# Patient Record
Sex: Female | Born: 1950 | Race: Black or African American | Hispanic: No | Smoking: Never smoker
Health system: Southern US, Community
[De-identification: ages and names within clinical notes are randomized; demographics above are authoritative.]

## PROBLEM LIST (undated history)

## (undated) DIAGNOSIS — T8859XA Other complications of anesthesia, initial encounter: Secondary | ICD-10-CM

## (undated) DIAGNOSIS — R7303 Prediabetes: Secondary | ICD-10-CM

## (undated) DIAGNOSIS — D352 Benign neoplasm of pituitary gland: Secondary | ICD-10-CM

## (undated) DIAGNOSIS — D649 Anemia, unspecified: Secondary | ICD-10-CM

## (undated) DIAGNOSIS — H409 Unspecified glaucoma: Secondary | ICD-10-CM

## (undated) DIAGNOSIS — I10 Essential (primary) hypertension: Secondary | ICD-10-CM

## (undated) DIAGNOSIS — M1990 Unspecified osteoarthritis, unspecified site: Secondary | ICD-10-CM

## (undated) HISTORY — PX: ABDOMINAL HYSTERECTOMY: SHX81

## (undated) HISTORY — DX: Anemia, unspecified: D64.9

## (undated) HISTORY — PX: BRAIN SURGERY: SHX531

## (undated) HISTORY — PX: CHOLECYSTECTOMY: SHX55

---

## 2004-10-22 ENCOUNTER — Ambulatory Visit: Payer: Self-pay | Admitting: Internal Medicine

## 2005-03-16 ENCOUNTER — Emergency Department: Payer: Self-pay | Admitting: Emergency Medicine

## 2005-07-23 ENCOUNTER — Ambulatory Visit: Payer: Self-pay | Admitting: Ophthalmology

## 2005-08-26 HISTORY — PX: BRAIN SURGERY: SHX531

## 2005-09-20 ENCOUNTER — Encounter: Payer: Self-pay | Admitting: Neurosurgery

## 2005-09-23 ENCOUNTER — Encounter: Payer: Self-pay | Admitting: Neurosurgery

## 2005-10-24 ENCOUNTER — Encounter: Payer: Self-pay | Admitting: Neurosurgery

## 2005-11-04 ENCOUNTER — Ambulatory Visit: Payer: Self-pay | Admitting: Internal Medicine

## 2005-11-23 HISTORY — PX: PITUITARY SURGERY: SHX203

## 2006-03-21 DIAGNOSIS — D352 Benign neoplasm of pituitary gland: Secondary | ICD-10-CM | POA: Diagnosis present

## 2006-03-21 DIAGNOSIS — D329 Benign neoplasm of meninges, unspecified: Secondary | ICD-10-CM | POA: Insufficient documentation

## 2006-03-31 ENCOUNTER — Ambulatory Visit: Payer: Self-pay

## 2007-03-01 ENCOUNTER — Ambulatory Visit: Payer: Self-pay

## 2007-03-03 ENCOUNTER — Ambulatory Visit: Payer: Self-pay

## 2008-07-09 ENCOUNTER — Ambulatory Visit: Payer: Self-pay | Admitting: Family Medicine

## 2009-07-15 ENCOUNTER — Ambulatory Visit: Payer: Self-pay | Admitting: Family Medicine

## 2010-07-28 ENCOUNTER — Ambulatory Visit: Payer: Self-pay | Admitting: Family Medicine

## 2011-08-25 ENCOUNTER — Ambulatory Visit: Payer: Self-pay | Admitting: Family Medicine

## 2013-07-24 ENCOUNTER — Ambulatory Visit: Payer: Self-pay | Admitting: Family Medicine

## 2014-08-05 ENCOUNTER — Ambulatory Visit: Payer: Self-pay | Admitting: Family Medicine

## 2015-08-13 ENCOUNTER — Other Ambulatory Visit: Payer: Self-pay | Admitting: Nurse Practitioner

## 2015-08-13 DIAGNOSIS — Z1231 Encounter for screening mammogram for malignant neoplasm of breast: Secondary | ICD-10-CM

## 2015-08-26 ENCOUNTER — Other Ambulatory Visit: Payer: Self-pay | Admitting: Nurse Practitioner

## 2015-08-26 ENCOUNTER — Ambulatory Visit
Admission: RE | Admit: 2015-08-26 | Discharge: 2015-08-26 | Disposition: A | Discharge status: Home / Self Care | Payer: Medicare Other | Source: Ambulatory Visit | Attending: Nurse Practitioner | Admitting: Nurse Practitioner

## 2015-08-26 DIAGNOSIS — Z1231 Encounter for screening mammogram for malignant neoplasm of breast: Secondary | ICD-10-CM | POA: Insufficient documentation

## 2015-10-24 ENCOUNTER — Encounter: Payer: Self-pay | Admitting: Emergency Medicine

## 2015-10-24 ENCOUNTER — Emergency Department: Payer: Medicare Other

## 2015-10-24 ENCOUNTER — Inpatient Hospital Stay
Admission: EM | Admit: 2015-10-24 | Discharge: 2015-10-26 | DRG: 193 | Disposition: A | Discharge status: Home / Self Care | Payer: Medicare Other | Attending: Internal Medicine | Admitting: Internal Medicine

## 2015-10-24 DIAGNOSIS — Z9049 Acquired absence of other specified parts of digestive tract: Secondary | ICD-10-CM

## 2015-10-24 DIAGNOSIS — Z90710 Acquired absence of both cervix and uterus: Secondary | ICD-10-CM | POA: Diagnosis not present

## 2015-10-24 DIAGNOSIS — Z8249 Family history of ischemic heart disease and other diseases of the circulatory system: Secondary | ICD-10-CM

## 2015-10-24 DIAGNOSIS — I248 Other forms of acute ischemic heart disease: Secondary | ICD-10-CM | POA: Diagnosis present

## 2015-10-24 DIAGNOSIS — J9801 Acute bronchospasm: Secondary | ICD-10-CM | POA: Diagnosis present

## 2015-10-24 DIAGNOSIS — Z7982 Long term (current) use of aspirin: Secondary | ICD-10-CM

## 2015-10-24 DIAGNOSIS — Z833 Family history of diabetes mellitus: Secondary | ICD-10-CM | POA: Diagnosis not present

## 2015-10-24 DIAGNOSIS — I10 Essential (primary) hypertension: Secondary | ICD-10-CM | POA: Diagnosis present

## 2015-10-24 DIAGNOSIS — M1990 Unspecified osteoarthritis, unspecified site: Secondary | ICD-10-CM | POA: Diagnosis present

## 2015-10-24 DIAGNOSIS — J189 Pneumonia, unspecified organism: Secondary | ICD-10-CM

## 2015-10-24 DIAGNOSIS — J1008 Influenza due to other identified influenza virus with other specified pneumonia: Principal | ICD-10-CM | POA: Diagnosis present

## 2015-10-24 DIAGNOSIS — H409 Unspecified glaucoma: Secondary | ICD-10-CM | POA: Diagnosis present

## 2015-10-24 DIAGNOSIS — J9601 Acute respiratory failure with hypoxia: Secondary | ICD-10-CM | POA: Diagnosis present

## 2015-10-24 DIAGNOSIS — Z79899 Other long term (current) drug therapy: Secondary | ICD-10-CM | POA: Diagnosis not present

## 2015-10-24 DIAGNOSIS — Z841 Family history of disorders of kidney and ureter: Secondary | ICD-10-CM

## 2015-10-24 HISTORY — DX: Unspecified osteoarthritis, unspecified site: M19.90

## 2015-10-24 HISTORY — DX: Essential (primary) hypertension: I10

## 2015-10-24 HISTORY — DX: Unspecified glaucoma: H40.9

## 2015-10-24 LAB — CBC WITH DIFFERENTIAL/PLATELET
BASOS ABS: 0 10*3/uL (ref 0–0.1)
Basophils Relative: 0 %
EOS ABS: 0 10*3/uL (ref 0–0.7)
EOS PCT: 0 %
HCT: 36.4 % (ref 35.0–47.0)
Hemoglobin: 11.8 g/dL — ABNORMAL LOW (ref 12.0–16.0)
LYMPHS ABS: 0.7 10*3/uL — AB (ref 1.0–3.6)
LYMPHS PCT: 9 %
MCH: 29.1 pg (ref 26.0–34.0)
MCHC: 32.5 g/dL (ref 32.0–36.0)
MCV: 89.5 fL (ref 80.0–100.0)
MONO ABS: 0.4 10*3/uL (ref 0.2–0.9)
Monocytes Relative: 5 %
Neutro Abs: 7 10*3/uL — ABNORMAL HIGH (ref 1.4–6.5)
Neutrophils Relative %: 86 %
PLATELETS: 117 10*3/uL — AB (ref 150–440)
RBC: 4.06 MIL/uL (ref 3.80–5.20)
RDW: 18.2 % — AB (ref 11.5–14.5)
WBC: 8.1 10*3/uL (ref 3.6–11.0)

## 2015-10-24 LAB — BASIC METABOLIC PANEL
Anion gap: 7 (ref 5–15)
BUN: 7 mg/dL (ref 6–20)
CALCIUM: 8.4 mg/dL — AB (ref 8.9–10.3)
CO2: 28 mmol/L (ref 22–32)
Chloride: 100 mmol/L — ABNORMAL LOW (ref 101–111)
Creatinine, Ser: 0.83 mg/dL (ref 0.44–1.00)
GFR calc Af Amer: >60 mL/min (ref >60)
GLUCOSE: 136 mg/dL — AB (ref 65–99)
Potassium: 3.2 mmol/L — ABNORMAL LOW (ref 3.5–5.1)
Sodium: 135 mmol/L (ref 135–145)

## 2015-10-24 LAB — INFLUENZA PANEL BY PCR (TYPE A & B)
H1N1FLUPCR: NOT DETECTED
Influenza A By PCR: NEGATIVE
Influenza B By PCR: POSITIVE — AB

## 2015-10-24 LAB — TROPONIN I
TROPONIN I: 0.04 ng/mL — AB (ref <0.031)
Troponin I: <0.03 ng/mL (ref <0.031)

## 2015-10-24 MED ORDER — LATANOPROST 0.005 % OP SOLN
1.0000 [drp] | Freq: Every day | OPHTHALMIC | Status: DC
  Administered 2015-10-25: 1 [drp] via OPHTHALMIC
  Filled 2015-10-24 (×2): qty 2.5

## 2015-10-24 MED ORDER — ACETAMINOPHEN 650 MG RE SUPP: 650.0000 mg | Freq: Four times a day (QID) | RECTAL | Status: DC | PRN

## 2015-10-24 MED ORDER — METHYLPREDNISOLONE SODIUM SUCC 125 MG IJ SOLR
125.0000 mg | Freq: Once | INTRAMUSCULAR | Status: AC
  Administered 2015-10-24: 125 mg via INTRAVENOUS
  Filled 2015-10-24: qty 2

## 2015-10-24 MED ORDER — IPRATROPIUM-ALBUTEROL 0.5-2.5 (3) MG/3ML IN SOLN
9.0000 mL | Freq: Once | RESPIRATORY_TRACT | Status: AC
  Administered 2015-10-24: 9 mL via RESPIRATORY_TRACT
  Filled 2015-10-24: qty 9

## 2015-10-24 MED ORDER — AMLODIPINE BESYLATE 10 MG PO TABS
10.0000 mg | ORAL_TABLET | Freq: Every day | ORAL | Status: DC
  Administered 2015-10-25 – 2015-10-26 (×2): 10 mg via ORAL
  Filled 2015-10-24 (×3): qty 1

## 2015-10-24 MED ORDER — LISINOPRIL 20 MG PO TABS
40.0000 mg | ORAL_TABLET | Freq: Every day | ORAL | Status: DC
  Administered 2015-10-25 – 2015-10-26 (×2): 40 mg via ORAL
  Filled 2015-10-24 (×3): qty 2

## 2015-10-24 MED ORDER — DEXTROSE 5 % IV SOLN
500.0000 mg | Freq: Once | INTRAVENOUS | Status: AC
  Administered 2015-10-24: 500 mg via INTRAVENOUS
  Filled 2015-10-24: qty 500

## 2015-10-24 MED ORDER — ACETAMINOPHEN 325 MG PO TABS: 650.0000 mg | ORAL_TABLET | Freq: Four times a day (QID) | ORAL | Status: DC | PRN

## 2015-10-24 MED ORDER — LISINOPRIL-HYDROCHLOROTHIAZIDE 20-12.5 MG PO TABS: 2.0000 | ORAL_TABLET | Freq: Every day | ORAL | Status: DC

## 2015-10-24 MED ORDER — DEXTROSE 5 % IV SOLN
250.0000 mg | INTRAVENOUS | Status: DC
  Administered 2015-10-25: 250 mg via INTRAVENOUS
  Filled 2015-10-24 (×2): qty 250

## 2015-10-24 MED ORDER — IPRATROPIUM-ALBUTEROL 0.5-2.5 (3) MG/3ML IN SOLN: 3.0000 mL | Freq: Four times a day (QID) | RESPIRATORY_TRACT | Status: DC | PRN

## 2015-10-24 MED ORDER — ONDANSETRON HCL 4 MG/2ML IJ SOLN: 4.0000 mg | Freq: Four times a day (QID) | INTRAMUSCULAR | Status: DC | PRN

## 2015-10-24 MED ORDER — DEXTROSE 5 % IV SOLN
2.0000 g | Freq: Once | INTRAVENOUS | Status: AC
  Administered 2015-10-24: 2 g via INTRAVENOUS
  Filled 2015-10-24: qty 2

## 2015-10-24 MED ORDER — DEXTROSE 5 % IV SOLN
1.0000 g | INTRAVENOUS | Status: DC
  Administered 2015-10-25: 1 g via INTRAVENOUS
  Filled 2015-10-24 (×2): qty 10

## 2015-10-24 MED ORDER — METHYLPREDNISOLONE SODIUM SUCC 40 MG IJ SOLR
40.0000 mg | Freq: Two times a day (BID) | INTRAMUSCULAR | Status: DC
  Administered 2015-10-25 – 2015-10-26 (×3): 40 mg via INTRAVENOUS
  Filled 2015-10-24 (×3): qty 1

## 2015-10-24 MED ORDER — ASPIRIN EC 81 MG PO TBEC
81.0000 mg | DELAYED_RELEASE_TABLET | Freq: Every day | ORAL | Status: DC
Start: 2015-10-24 — End: 2015-10-26
  Administered 2015-10-25 – 2015-10-26 (×2): 81 mg via ORAL
  Filled 2015-10-24 (×3): qty 1

## 2015-10-24 MED ORDER — ENOXAPARIN SODIUM 40 MG/0.4ML ~~LOC~~ SOLN
40.0000 mg | Freq: Two times a day (BID) | SUBCUTANEOUS | Status: DC
  Administered 2015-10-24 – 2015-10-26 (×4): 40 mg via SUBCUTANEOUS
  Filled 2015-10-24 (×4): qty 0.4

## 2015-10-24 MED ORDER — HYDROCHLOROTHIAZIDE 25 MG PO TABS
25.0000 mg | ORAL_TABLET | Freq: Every day | ORAL | Status: DC
  Administered 2015-10-25 – 2015-10-26 (×2): 25 mg via ORAL
  Filled 2015-10-24 (×3): qty 1

## 2015-10-24 MED ORDER — ONDANSETRON HCL 4 MG PO TABS: 4.0000 mg | ORAL_TABLET | Freq: Four times a day (QID) | ORAL | Status: DC | PRN

## 2015-10-24 NOTE — ED Notes (Signed)
Patient transported to X-ray 

## 2015-10-24 NOTE — Progress Notes (Signed)
Anticoagulation monitoring(Lovenox):  65 yo  ordered Lovenox 40 mg Q24h  Filed Weights   10/24/15 1420  Weight: 320 lb (145.151 kg)   BMI 53.4  Lab Results  Component Value Date   CREATININE 0.83 10/24/2015   Estimated Creatinine Clearance: 98.5 mL/min (by C-G formula based on Cr of 0.83). Hemoglobin & Hematocrit     Component Value Date/Time   HGB 11.8* 10/24/2015 1447   HCT 36.4 10/24/2015 1447     Per Protocol for Patient with estCrcl> 30 ml/min and BMI >40, will transition to Lovenox 40 mg Q12h.

## 2015-10-24 NOTE — H&P (Signed)
[facility] [place] NAME: Ashley Alvarado    MR#:  [id number]  DATE OF BIRTH:  03-30-1951  DATE OF ADMISSION:  10/24/2015  PRIMARY CARE PHYSICIAN: [place] [facility]   REQUESTING/REFERRING PHYSICIAN: Dr. Larae Grooms  CHIEF COMPLAINT:   Chief Complaint  Patient presents with  . Shortness of Breath    HISTORY OF PRESENT ILLNESS:  Ashley Alvarado  is a 65 y.o. female with a known history of hypertension, glaucoma, osteoarthritis of presents to the hospital with a 3 to four-day history of worsening shortness of breath cough and wheezing. He says that she has had shortness of breath now for the past 2-3 days and has progressively gotten worse 1 now she's short of breath even at rest. She says she was up most of the night coughing and feeling short of breath and had schedule an appointment to go see her primary care physician who then referred her to the ER for further evaluation. Emergency room patient was noted to be in acute hypoxic respiratory failure and hospitalist services were contacted further treatment evaluation. Patient did have a chest x-ray which was elicited of right middle lobe pneumonia. Patient also says that she's had a low-grade fever of 100.2 two days ago along with some body aches and malaise and chills.  PAST MEDICAL HISTORY:   Past Medical History  Diagnosis Date  . Hypertension   . Glaucoma   . Osteoarthritis     PAST SURGICAL HISTORY:   Past Surgical History  Procedure Laterality Date  . Brain surgery    . Cholecystectomy    . Abdominal hysterectomy      SOCIAL HISTORY:   Social History  Substance Use Topics  . Smoking status: Never Smoker   . Smokeless tobacco: Not on file  . Alcohol Use: No    FAMILY HISTORY:   Family History  Problem Relation Age of Onset  . Breast cancer Neg Hx   . Diabetes Mother   . Hypertension Mother   . Hypertension Father   . Kidney disease Mother      DRUG ALLERGIES:  No Known Allergies  REVIEW OF SYSTEMS:   Review of Systems  Constitutional: Positive for fever, chills and malaise/fatigue. Negative for weight loss.  HENT: Negative for congestion, nosebleeds and tinnitus.   Eyes: Negative for blurred vision, double vision and redness.  Respiratory: Positive for cough, sputum production, shortness of breath and wheezing. Negative for hemoptysis.   Cardiovascular: Negative for chest pain, orthopnea, leg swelling and PND.  Gastrointestinal: Negative for nausea, vomiting, abdominal pain, diarrhea and melena.  Genitourinary: Negative for dysuria, urgency and hematuria.  Musculoskeletal: Negative for joint pain and falls.  Neurological: Positive for weakness. Negative for dizziness, tingling, sensory change, focal weakness, seizures and headaches.  Endo/Heme/Allergies: Negative for polydipsia. Does not bruise/bleed easily.  Psychiatric/Behavioral: Negative for depression and memory loss. The patient is not nervous/anxious.     MEDICATIONS AT HOME:   Prior to Admission medications   Medication Sig Start Date End Date Taking? Authorizing Provider  amLODipine (NORVASC) 10 MG tablet Take 10 mg by mouth daily.   Yes Historical Provider, MD  aspirin EC 81 MG tablet Take 81 mg by mouth daily.   Yes Historical Provider, MD  latanoprost (XALATAN) 0.005 % ophthalmic solution Place 1 drop into both eyes at bedtime.   Yes Historical Provider, MD  lisinopril-hydrochlorothiazide (PRINZIDE,ZESTORETIC) 20-12.5 MG tablet Take 2 tablets by mouth daily.   Yes Historical  Provider, MD  meloxicam (MOBIC) 15 MG tablet Take 15 mg by mouth daily as needed for pain.   Yes Historical Provider, MD      VITAL SIGNS:  Blood pressure 145/93, pulse 100, temperature 99.5 F (37.5 C), temperature source Oral, resp. rate 20, height 5\' 5"  (1.651 m), weight 145.151 kg (320 lb), SpO2 98 %.  PHYSICAL EXAMINATION:  Physical Exam  GENERAL:  65 y.o.-year-old patient  lying in the bed in no acute distress.  EYES: Pupils equal, round, reactive to light and accommodation. No scleral icterus. Extraocular muscles intact.  HEENT: Head atraumatic, normocephalic. Oropharynx and nasopharynx clear. No oropharyngeal erythema, moist oral mucosa  NECK:  Supple, no jugular venous distention. No thyroid enlargement, no tenderness.  LUNGS: Diffuse end expiratory wheezing bilaterally, rales at the bases on the right side. No evidence of accessory muscles, no dullness to percussion. CARDIOVASCULAR: S1, S2 RRR. No murmurs, rubs, gallops, clicks.  ABDOMEN: Soft, nontender, nondistended. Bowel sounds present. No organomegaly or mass.  EXTREMITIES: No pedal edema, cyanosis, or clubbing. + 2 pedal & radial pulses b/l.   NEUROLOGIC: Cranial nerves II through XII are intact. No focal Motor or sensory deficits appreciated b/l PSYCHIATRIC: The patient is alert and oriented x 3. Good affect.  SKIN: No obvious rash, lesion, or ulcer.   LABORATORY PANEL:   CBC  Recent Labs Lab 10/24/15 1447  WBC 8.1  HGB 11.8*  HCT 36.4  PLT 117*   ------------------------------------------------------------------------------------------------------------------  Chemistries   Recent Labs Lab 10/24/15 1447  NA 135  K 3.2*  CL 100*  CO2 28  GLUCOSE 136*  BUN 7  CREATININE 0.83  CALCIUM 8.4*   ------------------------------------------------------------------------------------------------------------------  Cardiac Enzymes  Recent Labs Lab 10/24/15 1447  TROPONINI 0.04*   ------------------------------------------------------------------------------------------------------------------  RADIOLOGY:  Dg Chest 2 View  10/24/2015  CLINICAL DATA:  Shortness breath. Productive cough and fever for 3 days. EXAM: CHEST - 2 VIEW COMPARISON:  None. FINDINGS: The heart size is normal. The right lower lobe pneumonia is present. There is moderate pulmonary vascular congestion. There are no  significant effusions. The visualized soft tissues and bony thorax are unremarkable. IMPRESSION: 1. Right lower lobe pneumonia. 2. Moderate pulmonary vascular congestion without frank edema. Electronically Signed   By: San Morelle M.D.   On: 10/24/2015 15:43     IMPRESSION AND PLAN:   65 year old female with past medical history of hypertension, glaucoma, osteoarthritis of presents to the hospital with shortness of breath, cough, wheezing.  1. Acute respiratory failure with hypoxia-due to pneumonia, ?? Flu and I will check PCR for Flu.  -I will treat the patient with IV antibiotics with ceftriaxone, Zithromax. Follow clinically. -Given her mild wheezing and bronchospasm I will put her on some DuoNeb's when necessary and also some IV steroids.  2. Pneumonia-likely community-acquired. -IV ceftriaxone, Zithromax. Follow blood and sputum cultures.  3. Glaucoma-continue latanoprost eyedrops.  4. Essential hypertension-continue a syncopal/HCTZ, Norvasc.  5. Elevated troponin-due to demand ischemia from hypoxia. -I will cycle her cardiac markers. Observed and off unit telemetry for 24 hours.    All the records are reviewed and case discussed with ED provider. Management plans discussed with the patient, family and they are in agreement.  CODE STATUS: Full  TOTAL TIME TAKING CARE OF THIS PATIENT: 45 minutes.    Henreitta Leber M.D on 10/24/2015 at 5:47 PM  Between 7am to 6pm - Pager - [phone]  After 6pm go to [email or web address] - password [facility]  Office  [phone]  CC: Primary care physician; [facility]

## 2015-10-24 NOTE — ED Notes (Signed)
Pt with shortness of breath, productive cough, fever x 3 days. Pt 88% RA. 93% 2LNC. To [facility] who sent her here.

## 2015-10-24 NOTE — ED Notes (Signed)
Attempted to call report to the floor. 

## 2015-10-24 NOTE — ED Provider Notes (Signed)
[facility] Emergency Department Provider Note  ____________________________________________  Time seen: Approximately 3:05 PM  I have reviewed the triage vital signs and the nursing notes.   HISTORY  Chief Complaint Shortness of Breath    HPI Ashley Alvarado is a 65 y.o. female with a history of hypertension who is presenting to the emergency department today with 3 days of cough, runny nose and nausea. She says she is also had a fever at home. She says that her sputum has been brown tinged. She says that she went to the [facility] earlier today with because she had a very difficult time with shortness of breath throughout the night last night. She was found to have a fever there, given ibuprofen, and sent in to the emergency department for further evaluation. The patient denies any body aches. Says that she has wheezed in the past. However, denies smoking or any history of lung disease.   Past Medical History  Diagnosis Date  . Hypertension     There are no active problems to display for this patient.   Past Surgical History  Procedure Laterality Date  . Brain surgery    . Cholecystectomy    . Abdominal hysterectomy      No current outpatient prescriptions on file.  Allergies Review of patient's allergies indicates no known allergies.  Family History  Problem Relation Age of Onset  . Breast cancer Neg Hx     Social History Social History  Substance Use Topics  . Smoking status: Never Smoker   . Smokeless tobacco: Not on file  . Alcohol Use: No    Review of Systems Constitutional: No fever/chills Eyes: No visual changes. ENT: No sore throat. Cardiovascular: Denies chest pain. Respiratory: As above Gastrointestinal: No abdominal pain.  no vomiting.  No diarrhea.  No constipation. Genitourinary: Negative for dysuria. Musculoskeletal: Negative for back pain. Skin: Negative for rash. Neurological: Negative for headaches, focal  weakness or numbness.  10-point ROS otherwise negative.  ____________________________________________   PHYSICAL EXAM:  VITAL SIGNS: ED Triage Vitals  Enc Vitals Group     BP 10/24/15 1420 111/69 mmHg     Pulse Rate 10/24/15 1418 98     Resp 10/24/15 1430 20     Temp 10/24/15 1418 99.7 F (37.6 C)     Temp Source 10/24/15 1418 Oral     SpO2 10/24/15 1418 88 %     Weight 10/24/15 1420 320 lb (145.151 kg)     Height 10/24/15 1420 5\' 5"  (1.651 m)     Head Cir --      Peak Flow --      Pain Score --      Pain Loc --      Pain Edu? --      Excl. in [place]? --     Constitutional: Alert and oriented. Well appearing and in no acute distress. Eyes: Conjunctivae are normal. PERRL. EOMI. Head: Atraumatic. Nose: No congestion/rhinnorhea. Mouth/Throat: Mucous membranes are moist.  Oropharynx non-erythematous. Neck: No stridor.   Cardiovascular: Normal rate, regular rhythm. Grossly normal heart sounds.  Good peripheral circulation. Respiratory: Normal respiratory effort.  No retractions. Wheezing throughout with prolonged expiratory phase. Speaks in full sentences. Gastrointestinal: Soft and nontender. No distention.  No CVA tenderness. Musculoskeletal: No lower extremity tenderness nor edema.  No joint effusions. Neurologic:  Normal speech and language. No gross focal neurologic deficits are appreciated.  Skin:  Skin is warm, dry and intact. No rash noted. Psychiatric: Mood and  affect are normal. Speech and behavior are normal.  ____________________________________________   LABS (all labs ordered are listed, but only abnormal results are displayed)  Labs Reviewed  TROPONIN I - Abnormal; Notable for the following:    Troponin I 0.04 (*)    All other components within normal limits  CBC WITH DIFFERENTIAL/PLATELET - Abnormal; Notable for the following:    Hemoglobin 11.8 (*)    RDW 18.2 (*)    Platelets 117 (*)    Neutro Abs 7.0 (*)    Lymphs Abs 0.7 (*)    All other components  within normal limits  BASIC METABOLIC PANEL - Abnormal; Notable for the following:    Potassium 3.2 (*)    Chloride 100 (*)    Glucose, Bld 136 (*)    Calcium 8.4 (*)    All other components within normal limits   ____________________________________________  EKG  ED ECG REPORT I, Doran Stabler, the attending physician, personally viewed and interpreted this ECG.   Date: 10/24/2015  EKG Time: 1622  Rate: 98  Rhythm: normal sinus rhythm  Axis: Normal  Intervals:none  ST&T Change: No ST elevation or depression. No abnormal T-wave inversion.  ____________________________________________  [id number]      DG Chest 2 View (Final result) Result time: 10/24/15 15:43:18   Final result by Rad Results In Interface (10/24/15 15:43:18)   Narrative:   CLINICAL DATA: Shortness breath. Productive cough and fever for 3 days.  EXAM: CHEST - 2 VIEW  COMPARISON: None.  FINDINGS: The heart size is normal. The right lower lobe pneumonia is present. There is moderate pulmonary vascular congestion. There are no significant effusions. The visualized soft tissues and bony thorax are unremarkable.  IMPRESSION: 1. Right lower lobe pneumonia. 2. Moderate pulmonary vascular congestion without frank edema.   Electronically Signed By: San Morelle M.D. On: 10/24/2015 15:43    ____________________________________________   PROCEDURES    ____________________________________________   INITIAL IMPRESSION / ASSESSMENT AND PLAN / ED COURSE  Pertinent labs & imaging results that were available during my care of the patient were reviewed by me and considered in my medical decision making (see chart for details).  Disposition initially hypoxic and borderline febrile. Does not wearing oxygen at home. Possible bronchitis versus flu versus pneumonia. Patient has been having symptoms for 3 days and is therefore out of the window for Tamiflu usage. I will not be  swelling her for flu. I explained this to her that even if she was positive for the fluid it would not change treatment and she is understanding of this plan.  ----------------------------------------- 5:23 PM on 10/24/2015 -----------------------------------------  Patient says she is breathing better at this time however she is still wheezing diffusely with an extra cough. Still satting 90-92% on 2 L nasal cannula oxygen. We'll admit to the hospital for IV antibiotics. Patient denies being admitted to the hospital within the past 3 months. ____________________________________________   FINAL CLINICAL IMPRESSION(S) / ED DIAGNOSES  Community acquired pneumonia.    Orbie Pyo, MD 10/24/15 [phone]

## 2015-10-25 LAB — CBC
HCT: 35.8 % (ref 35.0–47.0)
Hemoglobin: 11.7 g/dL — ABNORMAL LOW (ref 12.0–16.0)
MCH: 28.9 pg (ref 26.0–34.0)
MCHC: 32.7 g/dL (ref 32.0–36.0)
MCV: 88.4 fL (ref 80.0–100.0)
PLATELETS: 120 10*3/uL — AB (ref 150–440)
RBC: 4.05 MIL/uL (ref 3.80–5.20)
RDW: 18.5 % — AB (ref 11.5–14.5)
WBC: 9.6 10*3/uL (ref 3.6–11.0)

## 2015-10-25 LAB — BASIC METABOLIC PANEL
Anion gap: 7 (ref 5–15)
BUN: 12 mg/dL (ref 6–20)
CALCIUM: 8 mg/dL — AB (ref 8.9–10.3)
CO2: 30 mmol/L (ref 22–32)
Chloride: 99 mmol/L — ABNORMAL LOW (ref 101–111)
Creatinine, Ser: 0.65 mg/dL (ref 0.44–1.00)
GFR calc Af Amer: >60 mL/min (ref >60)
GLUCOSE: 192 mg/dL — AB (ref 65–99)
POTASSIUM: 3.4 mmol/L — AB (ref 3.5–5.1)
SODIUM: 136 mmol/L (ref 135–145)

## 2015-10-25 LAB — TROPONIN I

## 2015-10-25 MED ORDER — OSELTAMIVIR PHOSPHATE 75 MG PO CAPS
75.0000 mg | ORAL_CAPSULE | Freq: Two times a day (BID) | ORAL | Status: DC
  Administered 2015-10-25 – 2015-10-26 (×3): 75 mg via ORAL
  Filled 2015-10-25 (×3): qty 1

## 2015-10-25 NOTE — Progress Notes (Signed)
[facility] at [place] NAME: Ashley Alvarado    MR#:  [id number]  DATE OF BIRTH:  September 10, 1950  SUBJECTIVE:  CHIEF COMPLAINT:   Chief Complaint  Patient presents with  . Shortness of Breath     Came with cough and SOB, feels little better with treatment here. FOund to have Pneumonia, Flu positive.  REVIEW OF SYSTEMS:  CONSTITUTIONAL: No fever, fatigue or weakness.  EYES: No blurred or double vision.  EARS, NOSE, AND THROAT: No tinnitus or ear pain.  RESPIRATORY: Positive for cough, shortness of breath, no wheezing or hemoptysis.  CARDIOVASCULAR: No chest pain, orthopnea, edema.  GASTROINTESTINAL: No nausea, vomiting, diarrhea or abdominal pain.  GENITOURINARY: No dysuria, hematuria.  ENDOCRINE: No polyuria, nocturia,  HEMATOLOGY: No anemia, easy bruising or bleeding SKIN: No rash or lesion. MUSCULOSKELETAL: No joint pain or arthritis.   NEUROLOGIC: No tingling, numbness, weakness.  PSYCHIATRY: No anxiety or depression.   ROS  DRUG ALLERGIES:  No Known Allergies  VITALS:  Blood pressure 111/48, pulse 76, temperature 98.3 F (36.8 C), temperature source Oral, resp. rate 20, height 5\' 5"  (1.651 m), weight 145.151 kg (320 lb), SpO2 97 %.  PHYSICAL EXAMINATION:  GENERAL:  65 y.o.-year-old patient lying in the bed with no acute distress.  EYES: Pupils equal, round, reactive to light and accommodation. No scleral icterus. Extraocular muscles intact.  HEENT: Head atraumatic, normocephalic. Oropharynx and nasopharynx clear.  NECK:  Supple, no jugular venous distention. No thyroid enlargement, no tenderness.  LUNGS: Normal breath sounds bilaterally, some wheezing, No crepitation. No use of accessory muscles of respiration.  CARDIOVASCULAR: S1, S2 normal. No murmurs, rubs, or gallops.  ABDOMEN: Soft, nontender, nondistended. Bowel sounds present. No organomegaly or mass.  EXTREMITIES: No pedal edema, cyanosis, or clubbing.   NEUROLOGIC: Cranial nerves II through XII are intact. Muscle strength 5/5 in all extremities. Sensation intact. Gait not checked.  PSYCHIATRIC: The patient is alert and oriented x 3.  SKIN: No obvious rash, lesion, or ulcer.   Physical Exam LABORATORY PANEL:   CBC  Recent Labs Lab 10/25/15 0242  WBC 9.6  HGB 11.7*  HCT 35.8  PLT 120*   ------------------------------------------------------------------------------------------------------------------  Chemistries   Recent Labs Lab 10/25/15 0242  NA 136  K 3.4*  CL 99*  CO2 30  GLUCOSE 192*  BUN 12  CREATININE 0.65  CALCIUM 8.0*   ------------------------------------------------------------------------------------------------------------------  Cardiac Enzymes  Recent Labs Lab 10/24/15 1947 10/25/15 0242  TROPONINI <0.03 <0.03   ------------------------------------------------------------------------------------------------------------------  RADIOLOGY:  Dg Chest 2 View  10/24/2015  CLINICAL DATA:  Shortness breath. Productive cough and fever for 3 days. EXAM: CHEST - 2 VIEW COMPARISON:  None. FINDINGS: The heart size is normal. The right lower lobe pneumonia is present. There is moderate pulmonary vascular congestion. There are no significant effusions. The visualized soft tissues and bony thorax are unremarkable. IMPRESSION: 1. Right lower lobe pneumonia. 2. Moderate pulmonary vascular congestion without frank edema. Electronically Signed   By: San Morelle M.D.   On: 10/24/2015 15:43    ASSESSMENT AND PLAN:   Active Problems:   Acute respiratory failure with hypoxia (HCC)  1. Acute respiratory failure with hypoxia-due to pneumonia, and influenza  - IV antibiotics with ceftriaxone, Zithromax. Follow clinically. -Given her mild wheezing and bronchospasm,on some DuoNeb's when necessary and also some IV steroids.  2. Pneumonia- community-acquired. -IV ceftriaxone, Zithromax. Follow blood and sputum  cultures.  3. Glaucoma-continue latanoprost eyedrops.  4. Essential hypertension-continue a syncopal/HCTZ, Norvasc.  5. Elevated troponin-due to demand ischemia from hypoxia. - stable on further follow ups.  6. Influenza   Give tamiflu.    All the records are reviewed and case discussed with Care Management/Social Workerr. Management plans discussed with the patient, family and they are in agreement.  CODE STATUS: Full  TOTAL TIME TAKING CARE OF THIS PATIENT: 35 minutes.     POSSIBLE D/C IN 1-2 DAYS, DEPENDING ON CLINICAL CONDITION.   Vaughan Basta M.D on 10/25/2015   Between 7am to 6pm - Pager - [phone]  After 6pm go to [email or web address] - password [email or web address] [facility]  [facility]  Office  [phone]  CC: Primary care physician; [facility]  Note: This dictation was prepared with Dragon dictation along with smaller phrase technology. Any transcriptional errors that result from this process are unintentional.

## 2015-10-26 MED ORDER — CARBAMIDE PEROXIDE 6.5 % OT SOLN: 5.0000 [drp] | Freq: Two times a day (BID) | OTIC | Status: DC

## 2015-10-26 MED ORDER — PREDNISONE 10 MG (21) PO TBPK: ORAL_TABLET | ORAL | Status: DC

## 2015-10-26 MED ORDER — AZITHROMYCIN 250 MG PO TABS: 250.0000 mg | ORAL_TABLET | Freq: Every day | ORAL | Status: DC

## 2015-10-26 MED ORDER — GUAIFENESIN 100 MG/5ML PO SOLN: 5.0000 mL | Freq: Four times a day (QID) | ORAL | Status: DC | PRN

## 2015-10-26 MED ORDER — GUAIFENESIN 100 MG/5ML PO SOLN
5.0000 mL | Freq: Four times a day (QID) | ORAL | Status: DC | PRN
  Administered 2015-10-26: 100 mg via ORAL
  Filled 2015-10-26: qty 10

## 2015-10-26 MED ORDER — OSELTAMIVIR PHOSPHATE 75 MG PO CAPS: 75.0000 mg | ORAL_CAPSULE | Freq: Two times a day (BID) | ORAL | Status: DC

## 2015-10-26 MED ORDER — CEPASTAT 14.5 MG MT LOZG
1.0000 | LOZENGE | OROMUCOSAL | Status: DC | PRN
  Filled 2015-10-26 (×2): qty 9

## 2015-10-26 MED ORDER — CEFUROXIME AXETIL 250 MG PO TABS: 250.0000 mg | ORAL_TABLET | Freq: Two times a day (BID) | ORAL | Status: DC

## 2015-10-26 NOTE — Progress Notes (Signed)
Discharge instructions reviewed with the pt.  rx given to the pt.  Pt sent out via wheelchair with a mask on to her waiting car

## 2015-10-26 NOTE — Progress Notes (Signed)
Order given to discontinue telemetry by MD.  Pt is being discharged

## 2015-10-26 NOTE — Discharge Summary (Signed)
[place] [facility] [place] NAME: Ashley Alvarado    MR#:  [id number]  DATE OF BIRTH:  10-Sep-1950  DATE OF ADMISSION:  10/24/2015 ADMITTING PHYSICIAN: Henreitta Leber, MD  DATE OF DISCHARGE: No discharge date for patient encounter.  PRIMARY CARE PHYSICIAN: [facility]    ADMISSION DIAGNOSIS:  CAP (community acquired pneumonia) [J18.9]  DISCHARGE DIAGNOSIS:  Active Problems:   Acute respiratory failure with hypoxia (HCC)    PNeumonia   Influenza  SECONDARY DIAGNOSIS:   Past Medical History  Diagnosis Date  . Hypertension   . Glaucoma   . Osteoarthritis     HOSPITAL COURSE:   1. Acute respiratory failure with hypoxia-due to pneumonia, and influenza  - IV antibiotics with ceftriaxone, Zithromax. Follow clinically. -Given her mild wheezing and bronchospasm,on some DuoNeb's when necessary and also some IV steroids. - Improved- switch steroids and Abx to oral on discharge.  2. Pneumonia- community-acquired. -IV ceftriaxone, Zithromax.    Improved.  3. Glaucoma-continue latanoprost eyedrops.  4. Essential hypertension-continue a syncopal/HCTZ, Norvasc.  5. Elevated troponin-due to demand ischemia from hypoxia. - stable on further follow ups.  6. Influenza B  Give tamiflu.  7. Wax on ear   Give debrox ear drops on d/c.  DISCHARGE CONDITIONS:   Stable.  CONSULTS OBTAINED:     DRUG ALLERGIES:  No Known Allergies  DISCHARGE MEDICATIONS:   Current Discharge Medication List    START taking these medications   Details  azithromycin (ZITHROMAX) 250 MG tablet Take 1 tablet (250 mg total) by mouth daily. Qty: 4 each, Refills: 0    carbamide peroxide (DEBROX) 6.5 % otic solution Place 5 drops into both ears 2 (two) times daily. Qty: 15 mL, Refills: 0    cefUROXime (CEFTIN) 250 MG tablet Take 1 tablet (250 mg total) by mouth 2 (two) times daily with a meal. Qty: 8 tablet, Refills: 0     guaiFENesin (ROBITUSSIN) 100 MG/5ML SOLN Take 5 mLs (100 mg total) by mouth every 6 (six) hours as needed for cough or to loosen phlegm. Qty: 1200 mL, Refills: 0    oseltamivir (TAMIFLU) 75 MG capsule Take 1 capsule (75 mg total) by mouth 2 (two) times daily. Qty: 4 capsule, Refills: 0    predniSONE (STERAPRED UNI-PAK 21 TAB) 10 MG (21) TBPK tablet Take 6 tabs first day, 5 tab on day 2, then 4 on day 3rd, 3 tabs on day 4th , 2 tab on day 5th, and 1 tab on 6th day. Qty: 21 tablet, Refills: 0      CONTINUE these medications which have NOT CHANGED   Details  amLODipine (NORVASC) 10 MG tablet Take 10 mg by mouth daily.    aspirin EC 81 MG tablet Take 81 mg by mouth daily.    latanoprost (XALATAN) 0.005 % ophthalmic solution Place 1 drop into both eyes at bedtime.    lisinopril-hydrochlorothiazide (PRINZIDE,ZESTORETIC) 20-12.5 MG tablet Take 2 tablets by mouth daily.    meloxicam (MOBIC) 15 MG tablet Take 15 mg by mouth daily as needed for pain.         DISCHARGE INSTRUCTIONS:   Follow with PMD in 2 weeks.  If you experience worsening of your admission symptoms, develop shortness of breath, life threatening emergency, suicidal or homicidal thoughts you must seek medical attention immediately by calling 911 or calling your MD immediately  if symptoms less severe.  You Must read complete instructions/literature along with all the possible adverse  reactions/side effects for all the Medicines you take and that have been prescribed to you. Take any new Medicines after you have completely understood and accept all the possible adverse reactions/side effects.   Please note  You were cared for by a hospitalist during your hospital stay. If you have any questions about your discharge medications or the care you received while you were in the hospital after you are discharged, you can call the unit and asked to speak with the hospitalist on call if the hospitalist that took care of you is not  available. Once you are discharged, your primary care physician will handle any further medical issues. Please note that NO REFILLS for any discharge medications will be authorized once you are discharged, as it is imperative that you return to your primary care physician (or establish a relationship with a primary care physician if you do not have one) for your aftercare needs so that they can reassess your need for medications and monitor your lab values.    Today   CHIEF COMPLAINT:   Chief Complaint  Patient presents with  . Shortness of Breath    HISTORY OF PRESENT ILLNESS:  Ashley Alvarado  is a 65 y.o. female with a known history of hypertension, glaucoma, osteoarthritis of presents to the hospital with a 3 to four-day history of worsening shortness of breath cough and wheezing. He says that she has had shortness of breath now for the past 2-3 days and has progressively gotten worse 1 now she's short of breath even at rest. She says she was up most of the night coughing and feeling short of breath and had schedule an appointment to go see her primary care physician who then referred her to the ER for further evaluation. Emergency room patient was noted to be in acute hypoxic respiratory failure and hospitalist services were contacted further treatment evaluation. Patient did have a chest x-ray which was elicited of right middle lobe pneumonia. Patient also says that she's had a low-grade fever of 100.2 two days ago along with some body aches and malaise and chills.   VITAL SIGNS:  Blood pressure 128/78, pulse 84, temperature 97.7 F (36.5 C), temperature source Oral, resp. rate 18, height 5\' 5"  (1.651 m), weight 145.151 kg (320 lb), SpO2 100 %.  I/O:   Intake/Output Summary (Last 24 hours) at 10/26/15 1153 Last data filed at 10/26/15 0901  Gross per 24 hour  Intake    755 ml  Output      0 ml  Net    755 ml    PHYSICAL EXAMINATION:   GENERAL: 65 y.o.-year-old patient lying in  the bed with no acute distress.  EYES: Pupils equal, round, reactive to light and accommodation. No scleral icterus. Extraocular muscles intact.  HEENT: Head atraumatic, normocephalic. Oropharynx and nasopharynx clear.  NECK: Supple, no jugular venous distention. No thyroid enlargement, no tenderness.  LUNGS: Normal breath sounds bilaterally, some wheezing, No crepitation. No use of accessory muscles of respiration.  CARDIOVASCULAR: S1, S2 normal. No murmurs, rubs, or gallops.  ABDOMEN: Soft, nontender, nondistended. Bowel sounds present. No organomegaly or mass.  EXTREMITIES: No pedal edema, cyanosis, or clubbing.  NEUROLOGIC: Cranial nerves II through XII are intact. Muscle strength 5/5 in all extremities. Sensation intact. Gait not checked.  PSYCHIATRIC: The patient is alert and oriented x 3.  SKIN: No obvious rash, lesion, or ulcer.   DATA REVIEW:   CBC  Recent Labs Lab 10/25/15 0242  WBC 9.6  HGB  11.7*  HCT 35.8  PLT 120*    Chemistries   Recent Labs Lab 10/25/15 0242  NA 136  K 3.4*  CL 99*  CO2 30  GLUCOSE 192*  BUN 12  CREATININE 0.65  CALCIUM 8.0*    Cardiac Enzymes  Recent Labs Lab 10/25/15 0242  TROPONINI <0.03    Microbiology Results  No results found for this or any previous visit.  RADIOLOGY:  Dg Chest 2 View  10/24/2015  CLINICAL DATA:  Shortness breath. Productive cough and fever for 3 days. EXAM: CHEST - 2 VIEW COMPARISON:  None. FINDINGS: The heart size is normal. The right lower lobe pneumonia is present. There is moderate pulmonary vascular congestion. There are no significant effusions. The visualized soft tissues and bony thorax are unremarkable. IMPRESSION: 1. Right lower lobe pneumonia. 2. Moderate pulmonary vascular congestion without frank edema. Electronically Signed   By: San Morelle M.D.   On: 10/24/2015 15:43    EKG:   Orders placed or performed during the hospital encounter of 10/24/15  . ED EKG  . ED EKG   . EKG 12-Lead  . EKG 12-Lead  . EKG 12-Lead  . EKG 12-Lead      Management plans discussed with the patient, family and they are in agreement.  CODE STATUS:     Code Status Orders        Start     Ordered   10/24/15 2112  Full code   Continuous     10/24/15 2111    Code Status History    Date Active Date Inactive Code Status Order ID Comments User Context   This patient has a current code status but no historical code status.      TOTAL TIME TAKING CARE OF THIS PATIENT: 35 minutes.    Vaughan Basta M.D on 10/26/2015 at 11:53 AM  Between 7am to 6pm - Pager - [phone]  After 6pm go to [email or web address] - password [email or web address] [facility]  [facility]  Office  [phone]  CC: Primary care physician; [facility]   Note: This dictation was prepared with Dragon dictation along with smaller phrase technology. Any transcriptional errors that result from this process are unintentional.

## 2015-10-26 NOTE — Care Management Important Message (Signed)
Important Message  Patient Details  Name: Ashley Alvarado MRN: [id number] Date of Birth: 1950-11-19   Medicare Important Message Given:  Yes    Desmond Szabo A, RN 10/26/2015, 1:46 PM

## 2015-11-26 ENCOUNTER — Ambulatory Visit
Admission: RE | Admit: 2015-11-26 | Discharge: 2015-11-26 | Disposition: A | Discharge status: Home / Self Care | Payer: Medicare Other | Source: Ambulatory Visit | Attending: Nurse Practitioner | Admitting: Nurse Practitioner

## 2015-11-26 ENCOUNTER — Other Ambulatory Visit: Payer: Self-pay | Admitting: Nurse Practitioner

## 2015-11-26 DIAGNOSIS — J189 Pneumonia, unspecified organism: Secondary | ICD-10-CM | POA: Insufficient documentation

## 2016-02-24 HISTORY — PX: PITUITARY SURGERY: SHX203

## 2016-07-21 ENCOUNTER — Other Ambulatory Visit: Payer: Self-pay | Admitting: Nurse Practitioner

## 2016-07-21 DIAGNOSIS — Z1231 Encounter for screening mammogram for malignant neoplasm of breast: Secondary | ICD-10-CM

## 2016-08-26 ENCOUNTER — Ambulatory Visit
Admission: RE | Admit: 2016-08-26 | Discharge: 2016-08-26 | Disposition: A | Discharge status: Home / Self Care | Payer: Medicare Other | Source: Ambulatory Visit | Attending: Nurse Practitioner | Admitting: Nurse Practitioner

## 2016-08-26 DIAGNOSIS — Z1231 Encounter for screening mammogram for malignant neoplasm of breast: Secondary | ICD-10-CM | POA: Diagnosis not present

## 2017-10-10 ENCOUNTER — Other Ambulatory Visit: Payer: Self-pay | Admitting: Nurse Practitioner

## 2017-10-10 DIAGNOSIS — Z1231 Encounter for screening mammogram for malignant neoplasm of breast: Secondary | ICD-10-CM

## 2017-10-26 ENCOUNTER — Ambulatory Visit
Admission: RE | Admit: 2017-10-26 | Discharge: 2017-10-26 | Disposition: A | Discharge status: Home / Self Care | Payer: Commercial Managed Care - HMO | Source: Ambulatory Visit | Attending: Nurse Practitioner | Admitting: Nurse Practitioner

## 2017-10-26 DIAGNOSIS — Z1231 Encounter for screening mammogram for malignant neoplasm of breast: Secondary | ICD-10-CM | POA: Insufficient documentation

## 2017-10-31 ENCOUNTER — Other Ambulatory Visit: Payer: Self-pay | Admitting: Nurse Practitioner

## 2017-10-31 DIAGNOSIS — R928 Other abnormal and inconclusive findings on diagnostic imaging of breast: Secondary | ICD-10-CM

## 2017-11-03 ENCOUNTER — Ambulatory Visit
Admission: RE | Admit: 2017-11-03 | Discharge: 2017-11-03 | Disposition: A | Discharge status: Home / Self Care | Payer: Medicare HMO | Source: Ambulatory Visit | Attending: Nurse Practitioner | Admitting: Nurse Practitioner

## 2017-11-03 DIAGNOSIS — R928 Other abnormal and inconclusive findings on diagnostic imaging of breast: Secondary | ICD-10-CM | POA: Diagnosis not present

## 2018-12-26 ENCOUNTER — Other Ambulatory Visit: Payer: Self-pay | Admitting: Family Medicine

## 2018-12-26 DIAGNOSIS — Z1231 Encounter for screening mammogram for malignant neoplasm of breast: Secondary | ICD-10-CM

## 2019-02-02 ENCOUNTER — Ambulatory Visit
Admission: RE | Admit: 2019-02-02 | Discharge: 2019-02-02 | Disposition: A | Discharge status: Home / Self Care | Payer: Medicare HMO | Source: Ambulatory Visit | Attending: Family Medicine | Admitting: Family Medicine

## 2019-02-02 ENCOUNTER — Other Ambulatory Visit: Payer: Self-pay

## 2019-02-02 DIAGNOSIS — Z1231 Encounter for screening mammogram for malignant neoplasm of breast: Secondary | ICD-10-CM | POA: Insufficient documentation

## 2020-02-14 ENCOUNTER — Other Ambulatory Visit: Payer: Self-pay | Admitting: Family Medicine

## 2020-02-14 DIAGNOSIS — Z1231 Encounter for screening mammogram for malignant neoplasm of breast: Secondary | ICD-10-CM

## 2020-03-04 ENCOUNTER — Other Ambulatory Visit: Payer: Self-pay

## 2020-03-04 ENCOUNTER — Ambulatory Visit
Admission: RE | Admit: 2020-03-04 | Discharge: 2020-03-04 | Disposition: A | Discharge status: Home / Self Care | Payer: Medicare HMO | Source: Ambulatory Visit | Attending: Family Medicine | Admitting: Family Medicine

## 2020-03-04 DIAGNOSIS — Z1231 Encounter for screening mammogram for malignant neoplasm of breast: Secondary | ICD-10-CM | POA: Diagnosis present

## 2021-02-15 ENCOUNTER — Other Ambulatory Visit: Payer: Self-pay

## 2021-02-15 ENCOUNTER — Emergency Department: Payer: Medicare HMO

## 2021-02-15 ENCOUNTER — Observation Stay
Admission: EM | Admit: 2021-02-15 | Discharge: 2021-02-18 | Disposition: A | Discharge status: Home / Self Care | Payer: Medicare HMO | Attending: Hospitalist | Admitting: Hospitalist

## 2021-02-15 ENCOUNTER — Encounter: Payer: Self-pay | Admitting: Emergency Medicine

## 2021-02-15 DIAGNOSIS — I10 Essential (primary) hypertension: Secondary | ICD-10-CM | POA: Diagnosis not present

## 2021-02-15 DIAGNOSIS — R945 Abnormal results of liver function studies: Secondary | ICD-10-CM

## 2021-02-15 DIAGNOSIS — Z79899 Other long term (current) drug therapy: Secondary | ICD-10-CM | POA: Diagnosis not present

## 2021-02-15 DIAGNOSIS — D500 Iron deficiency anemia secondary to blood loss (chronic): Secondary | ICD-10-CM | POA: Insufficient documentation

## 2021-02-15 DIAGNOSIS — D352 Benign neoplasm of pituitary gland: Secondary | ICD-10-CM | POA: Diagnosis present

## 2021-02-15 DIAGNOSIS — K5900 Constipation, unspecified: Secondary | ICD-10-CM

## 2021-02-15 DIAGNOSIS — K317 Polyp of stomach and duodenum: Secondary | ICD-10-CM | POA: Insufficient documentation

## 2021-02-15 DIAGNOSIS — Z7982 Long term (current) use of aspirin: Secondary | ICD-10-CM | POA: Diagnosis not present

## 2021-02-15 DIAGNOSIS — D649 Anemia, unspecified: Secondary | ICD-10-CM | POA: Diagnosis present

## 2021-02-15 DIAGNOSIS — K921 Melena: Secondary | ICD-10-CM

## 2021-02-15 DIAGNOSIS — R531 Weakness: Secondary | ICD-10-CM | POA: Diagnosis present

## 2021-02-15 DIAGNOSIS — K5730 Diverticulosis of large intestine without perforation or abscess without bleeding: Secondary | ICD-10-CM | POA: Diagnosis not present

## 2021-02-15 DIAGNOSIS — Z20822 Contact with and (suspected) exposure to covid-19: Secondary | ICD-10-CM | POA: Insufficient documentation

## 2021-02-15 DIAGNOSIS — M1990 Unspecified osteoarthritis, unspecified site: Secondary | ICD-10-CM | POA: Diagnosis present

## 2021-02-15 DIAGNOSIS — R7989 Other specified abnormal findings of blood chemistry: Secondary | ICD-10-CM

## 2021-02-15 DIAGNOSIS — K621 Rectal polyp: Secondary | ICD-10-CM | POA: Diagnosis not present

## 2021-02-15 LAB — CBC
HCT: 18.9 % — ABNORMAL LOW (ref 36.0–46.0)
Hemoglobin: 6.3 g/dL — ABNORMAL LOW (ref 12.0–15.0)
MCH: 33.2 pg (ref 26.0–34.0)
MCHC: 33.3 g/dL (ref 30.0–36.0)
MCV: 99.5 fL (ref 80.0–100.0)
Platelets: 107 10*3/uL — ABNORMAL LOW (ref 150–400)
RBC: 1.9 MIL/uL — ABNORMAL LOW (ref 3.87–5.11)
WBC: 5.4 10*3/uL (ref 4.0–10.5)
nRBC: 2.6 % — ABNORMAL HIGH (ref 0.0–0.2)

## 2021-02-15 LAB — IRON AND TIBC
Iron: 144 ug/dL (ref 28–170)
Saturation Ratios: 52 % — ABNORMAL HIGH (ref 10.4–31.8)
TIBC: 276 ug/dL (ref 250–450)
UIBC: 132 ug/dL

## 2021-02-15 LAB — RETICULOCYTES
Immature Retic Fract: 23 % — ABNORMAL HIGH (ref 2.3–15.9)
RBC.: 1.89 MIL/uL — ABNORMAL LOW (ref 3.87–5.11)
Retic Count, Absolute: 45.5 10*3/uL (ref 19.0–186.0)
Retic Ct Pct: 2.4 % (ref 0.4–3.1)

## 2021-02-15 LAB — BASIC METABOLIC PANEL
Anion gap: 7 (ref 5–15)
BUN: 23 mg/dL (ref 8–23)
CO2: 27 mmol/L (ref 22–32)
Calcium: 9.6 mg/dL (ref 8.9–10.3)
Chloride: 106 mmol/L (ref 98–111)
Creatinine, Ser: 1.08 mg/dL — ABNORMAL HIGH (ref 0.44–1.00)
GFR, Estimated: 55 mL/min — ABNORMAL LOW (ref >60)
Glucose, Bld: 184 mg/dL — ABNORMAL HIGH (ref 70–99)
Potassium: 4 mmol/L (ref 3.5–5.1)
Sodium: 140 mmol/L (ref 135–145)

## 2021-02-15 LAB — HEPATIC FUNCTION PANEL
ALT: 48 U/L — ABNORMAL HIGH (ref 0–44)
AST: 109 U/L — ABNORMAL HIGH (ref 15–41)
Albumin: 4.2 g/dL (ref 3.5–5.0)
Alkaline Phosphatase: 49 U/L (ref 38–126)
Bilirubin, Direct: 0.4 mg/dL — ABNORMAL HIGH (ref 0.0–0.2)
Indirect Bilirubin: 1.3 mg/dL — ABNORMAL HIGH (ref 0.3–0.9)
Total Bilirubin: 1.7 mg/dL — ABNORMAL HIGH (ref 0.3–1.2)
Total Protein: 7.1 g/dL (ref 6.5–8.1)

## 2021-02-15 LAB — PREPARE RBC (CROSSMATCH)

## 2021-02-15 LAB — ABO/RH: ABO/RH(D): O POS

## 2021-02-15 LAB — RESP PANEL BY RT-PCR (FLU A&B, COVID) ARPGX2
Influenza A by PCR: NEGATIVE
Influenza B by PCR: NEGATIVE
SARS Coronavirus 2 by RT PCR: NEGATIVE

## 2021-02-15 MED ORDER — ACETAMINOPHEN 650 MG RE SUPP: 650.0000 mg | Freq: Four times a day (QID) | RECTAL | Status: DC | PRN

## 2021-02-15 MED ORDER — ONDANSETRON HCL 4 MG PO TABS: 4.0000 mg | ORAL_TABLET | Freq: Four times a day (QID) | ORAL | Status: DC | PRN

## 2021-02-15 MED ORDER — ACETAMINOPHEN 325 MG PO TABS: 650.0000 mg | ORAL_TABLET | Freq: Four times a day (QID) | ORAL | Status: DC | PRN

## 2021-02-15 MED ORDER — ONDANSETRON HCL 4 MG/2ML IJ SOLN: 4.0000 mg | Freq: Four times a day (QID) | INTRAMUSCULAR | Status: DC | PRN

## 2021-02-15 MED ORDER — SENNOSIDES-DOCUSATE SODIUM 8.6-50 MG PO TABS: 1.0000 | ORAL_TABLET | Freq: Every evening | ORAL | Status: DC | PRN

## 2021-02-15 MED ORDER — PANTOPRAZOLE SODIUM 40 MG IV SOLR
40.0000 mg | INTRAVENOUS | Status: DC
  Administered 2021-02-16 – 2021-02-17 (×3): 40 mg via INTRAVENOUS
  Filled 2021-02-15 (×3): qty 40

## 2021-02-15 MED ORDER — SODIUM CHLORIDE 0.9% IV SOLUTION
Freq: Once | INTRAVENOUS | Status: DC
  Filled 2021-02-15: qty 250

## 2021-02-15 MED ORDER — SODIUM CHLORIDE 0.9 % IV SOLN
10.0000 mL/h | Freq: Once | INTRAVENOUS | Status: AC
  Administered 2021-02-15: 10 mL/h via INTRAVENOUS

## 2021-02-15 NOTE — ED Triage Notes (Signed)
Pt [place] [facility] [place] c/o weakness and nausea.  102/60 but then came up to [id number] systolic.  1 IV attempt made, but unsuccessful.  CBG 217, 97 % RA, 12 lead unremarkable with HR at 84.  Pt neurologically intact at this time with even and unlabored respirations.

## 2021-02-15 NOTE — ED Provider Notes (Signed)
[facility] Emergency Department Provider Note ____________________________________________   Event Date/Time   First MD Initiated Contact with Patient 02/15/21 1629     (approximate)  I have reviewed the triage vital signs and the nursing notes.   HISTORY  Chief Complaint Weakness  HPI Ashley Alvarado is a 70 y.o. female with history of brain tumor, glaucoma, hypertension and osteoarthritis presents to the emergency department for treatment and evaluation of weakness and nausea without vomiting. She also states that she fell about a month ago and hit her head on the dryer. She has had a slight headache since then. She was not evaluated after the fall.          Past Medical History:  Diagnosis Date   Glaucoma    Hypertension    Osteoarthritis     Patient Active Problem List   Diagnosis Date Noted   Acute respiratory failure with hypoxia ([place]) 10/24/2015    Past Surgical History:  Procedure Laterality Date   ABDOMINAL HYSTERECTOMY     BRAIN SURGERY     CHOLECYSTECTOMY      Prior to Admission medications   Medication Sig Start Date End Date Taking? Authorizing Provider  amLODipine (NORVASC) 10 MG tablet Take 10 mg by mouth daily.    [provider]  aspirin EC 81 MG tablet Take 81 mg by mouth daily.    [provider]  azithromycin (ZITHROMAX) 250 MG tablet Take 1 tablet (250 mg total) by mouth daily. 10/26/15   Vaughan Basta, MD  carbamide peroxide (DEBROX) 6.5 % otic solution Place 5 drops into both ears 2 (two) times daily. 10/26/15   Vaughan Basta, MD  cefUROXime (CEFTIN) 250 MG tablet Take 1 tablet (250 mg total) by mouth 2 (two) times daily with a meal. 10/26/15   Vaughan Basta, MD  guaiFENesin (ROBITUSSIN) 100 MG/5ML SOLN Take 5 mLs (100 mg total) by mouth every 6 (six) hours as needed for cough or to loosen phlegm. 10/26/15   Vaughan Basta, MD  latanoprost (XALATAN) 0.005 % ophthalmic  solution Place 1 drop into both eyes at bedtime.    [provider]  lisinopril-hydrochlorothiazide (PRINZIDE,ZESTORETIC) 20-12.5 MG tablet Take 2 tablets by mouth daily.    [provider]  meloxicam (MOBIC) 15 MG tablet Take 15 mg by mouth daily as needed for pain.    [provider]  oseltamivir (TAMIFLU) 75 MG capsule Take 1 capsule (75 mg total) by mouth 2 (two) times daily. 10/26/15   Vaughan Basta, MD  predniSONE (STERAPRED UNI-PAK 21 TAB) 10 MG (21) TBPK tablet Take 6 tabs first day, 5 tab on day 2, then 4 on day 3rd, 3 tabs on day 4th , 2 tab on day 5th, and 1 tab on 6th day. 10/26/15   Vaughan Basta, MD    Allergies Patient has no known allergies.  Family History  Problem Relation Age of Onset   Hypertension Father    Diabetes Mother    Hypertension Mother    Kidney disease Mother    Breast cancer Neg Hx     Social History Social History   Tobacco Use   Smoking status: Never  Substance Use Topics   Alcohol use: No    Alcohol/week: 0.0 standard drinks   Drug use: No    Review of Systems  Constitutional: No fever/chills Eyes: No visual changes. No blurred vision. ENT: No sore throat. Cardiovascular: Denies chest pain. Respiratory: Denies shortness of breath. Gastrointestinal: No abdominal pain.  Positive for nausea, no vomiting.  Genitourinary: Negative for dysuria. Musculoskeletal: Negative for back pain. Skin: Negative for rash. Neurological: Positive for headaches, focal weakness or numbness. ____________________________________________   PHYSICAL EXAM:  VITAL SIGNS: ED Triage Vitals  Enc Vitals Group     BP 02/15/21 1317 134/62     Pulse Rate 02/15/21 1317 86     Resp 02/15/21 1317 18     Temp 02/15/21 1317 98.4 F (36.9 C)     Temp Source 02/15/21 1317 Oral     SpO2 02/15/21 1317 100 %     Weight 02/15/21 1309 (!) 320 lb 1.7 oz (145.2 kg)     Height 02/15/21 1309 '5\' 5"'$  (1.651 m)     Head Circumference --       Peak Flow --      Pain Score 02/15/21 1308 0     Pain Loc --      Pain Edu? --      Excl. in [place]? --     Constitutional: Alert and oriented. Well appearing and in no acute distress. Eyes: Conjunctivae are normal.  Head: Atraumatic. Nose: No congestion/rhinnorhea. Mouth/Throat: Mucous membranes are moist.  Oropharynx non-erythematous. Neck: No stridor.   Hematological/Lymphatic/Immunilogical: No cervical lymphadenopathy. Cardiovascular: Normal rate, regular rhythm. Grossly normal heart sounds.  Good peripheral circulation. Respiratory: Normal respiratory effort.  No retractions. Lungs CTAB. Gastrointestinal: Soft and nontender. No distention. No abdominal bruits. Melena. Genitourinary:  Musculoskeletal: No lower extremity tenderness nor edema.  No joint effusions. Neurologic:  Normal speech and language. No gross focal neurologic deficits are appreciated. No gait instability. Skin:  Skin is warm, dry and intact. No rash noted. Psychiatric: Mood and affect are normal. Speech and behavior are normal.  ____________________________________________   LABS (all labs ordered are listed, but only abnormal results are displayed)  Labs Reviewed  BASIC METABOLIC PANEL - Abnormal; Notable for the following components:      Result Value   Glucose, Bld 184 (*)    Creatinine, Ser 1.08 (*)    GFR, Estimated 55 (*)    All other components within normal limits  CBC - Abnormal; Notable for the following components:   RBC 1.90 (*)    Hemoglobin 6.3 (*)    HCT 18.9 (*)    Platelets 107 (*)    nRBC 2.6 (*)    All other components within normal limits  IRON AND TIBC - Abnormal; Notable for the following components:   Saturation Ratios 52 (*)    All other components within normal limits  RETICULOCYTES - Abnormal; Notable for the following components:   RBC. 1.89 (*)    Immature Retic Fract 23.0 (*)    All other components within normal limits  HEPATIC FUNCTION PANEL - Abnormal; Notable  for the following components:   AST 109 (*)    ALT 48 (*)    Total Bilirubin 1.7 (*)    Bilirubin, Direct 0.4 (*)    Indirect Bilirubin 1.3 (*)    All other components within normal limits  RESP PANEL BY RT-PCR (FLU A&B, COVID) ARPGX2  URINALYSIS, COMPLETE (UACMP) WITH MICROSCOPIC  TYPE AND SCREEN  TYPE AND SCREEN  ABO/RH  PREPARE RBC (CROSSMATCH)   ____________________________________________  EKG  ED ECG REPORT I, Ladasia Sircy, FNP-BC personally viewed and interpreted this ECG.   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    Chest x-ray negative for acute cardiopulmonary abnormality.  I, Sherrie George, personally viewed and evaluated these images (plain radiographs) as part of my medical decision making, as well as reviewing the written report by the radiologist.  Official radiology report(s): DG Chest 1 View  Result Date: 02/15/2021 CLINICAL DATA:  Weakness and shortness of breath.  Nausea EXAM: CHEST  1 VIEW COMPARISON:  11/26/2015 FINDINGS: Elevation of the right hemidiaphragm. Heart size and pulmonary vascularity are normal for technique. Lungs are clear. No pleural effusions. No pneumothorax. Mediastinal contours appear intact. Degenerative changes in the spine and shoulders. IMPRESSION: No active disease. Electronically Signed   By: Lucienne Capers M.D.   On: 02/15/2021 17:15   CT Head Wo Contrast  Result Date: 02/15/2021 CLINICAL DATA:  Head trauma, minor (Age >= 65y) EXAM: CT HEAD WITHOUT CONTRAST TECHNIQUE: Contiguous axial images were obtained from the base of the skull through the vertex without intravenous contrast. COMPARISON:  July 23, 2005 FINDINGS: Brain: No acute infarction or  hemorrhage. Postsurgical changes of the RIGHT frontal lobe. 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 It is estimated to measure 15 x 14 mm (series 4, image 28). Vascular: No hyperdense vessel or unexpected calcification. Skull: Postsurgical changes status post resection of a mass in the RIGHT frontal lobe. No acute fracture. Sinuses/Orbits: Moderate mucosal thickening of bilateral maxillary sinuses and the sphenoid sinus. Other: None. IMPRESSION: 1. 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 Recommend further dedicated evaluation with pituitary protocol MRI with and without contrast if not previously performed. 2. Otherwise no acute intracranial abnormality. Electronically Signed   By: Valentino Saxon MD   On: 02/15/2021 17:56    ____________________________________________   PROCEDURES  Procedure(s) performed (including Critical Care):  Procedures  ____________________________________________   INITIAL IMPRESSION / ASSESSMENT AND PLAN     70 year old female presenting to the emergency department for treatment and evaluation of weakness and nausea without vomiting.  See HPI for further details.  While awaiting ER room assignment, labs were drawn.  Hemoglobin is 6.3 with a hematocrit of 18.9  DIFFERENTIAL DIAGNOSIS  Cardiac event, electrolyte imbalance, COVID-19, UTI  ED COURSE  Hemoccult positive on exam.  1 unit of blood to be transfused.  Risks and benefits obtained with the patient who states that she understands and consents to receive blood products.  CT shows a concerning masslike appearance on the pituitary gland with a recommendation of additional imaging with MRI.  Hospitalist consult requested.  Dr. Damita Dunnings accepts patient for admission.    ___________________________________________   FINAL CLINICAL IMPRESSION(S) / ED DIAGNOSES  Final diagnoses:  Symptomatic anemia     ED Discharge Orders      None        Pearlie R Polimeni was evaluated in Emergency Department on 02/15/2021 for the symptoms described in the history of present illness. She was evaluated in the context of the global COVID-19 pandemic, which necessitated consideration that the patient might be at risk for infection with the SARS-CoV-2 virus that causes COVID-19. Institutional protocols and algorithms that pertain to the evaluation of patients at risk for COVID-19 are in a state of rapid change based on information released by regulatory bodies including the CDC and federal and state organizations. These policies and algorithms were followed during the patient's care in the ED.   Note:  This document  was prepared using Systems analyst and may include unintentional dictation errors.    Victorino Dike, FNP 02/15/21 2038    Nance Pear, MD 02/15/21 [phone]

## 2021-02-15 NOTE — H&P (Addendum)
History and Physical    Ashley Alvarado [id number] DOB: [birth year] DOA: 02/15/2021  PCP: Frazier Richards, MD   Patient coming from: home  I have personally briefly reviewed patient's old medical records in [place]  Chief Complaint: weakness  HPI: Ashley Alvarado is a 70 y.o. female with medical history significant for HTN, osteoarthritis on chronic NSAIDs, BMI over 50, chronic constipation, pituitary adenoma with history of radiation who follows with endocrinology, who presents to the ED with a 1 month history of weakness and of late palpitations, lightheadedness until today when she felt dizzy and started seeing black spots and felt like she was about to pass out and decided to come into the ED to get checked out.  States for the past month she has been seeing bright red blood in her stool intermittently especially when she strained to have a bowel movement.  States she had a normal colonoscopy about 5 years ago.  Has nausea but without vomiting.  Takes NSAIDs about 3 times a week for osteoarthritic pain.  Has occasional acid reflux symptoms.  She denies chest pain but occasionally feels short of breath.  Denies cough, fever or chills  ED course: On arrival afebrile, BP 122/67 with pulse 76 O2 sat 100% on room air. Hemoglobin 6.3 with no recent baseline on record mild AST/ALT elevation of 109/48 creatinine 1.08.  Hemoccult positive  EKG, personally viewed and interpreted NSR at 88 with no acute ST-T wave elevation  Imaging: Chest x-ray with no acute disease  Patient started on 1 unit PRBC.  Hospitalist consulted for admission.  Review of Systems: As per HPI otherwise all other systems on review of systems negative.    Past Medical History:  Diagnosis Date   Glaucoma    Hypertension    Osteoarthritis     Past Surgical History:  Procedure Laterality Date   ABDOMINAL HYSTERECTOMY     BRAIN SURGERY     CHOLECYSTECTOMY       reports that she does not drink  alcohol and does not use drugs. No history on file for tobacco use.  No Known Allergies  Family History  Problem Relation Age of Onset   Hypertension Father    Diabetes Mother    Hypertension Mother    Kidney disease Mother    Breast cancer Neg Hx       Prior to Admission medications   Medication Sig Start Date End Date Taking? Authorizing Provider  amLODipine (NORVASC) 10 MG tablet Take 10 mg by mouth daily.    [provider]  aspirin EC 81 MG tablet Take 81 mg by mouth daily.    [provider]  azithromycin (ZITHROMAX) 250 MG tablet Take 1 tablet (250 mg total) by mouth daily. 10/26/15   Vaughan Basta, MD  carbamide peroxide (DEBROX) 6.5 % otic solution Place 5 drops into both ears 2 (two) times daily. 10/26/15   Vaughan Basta, MD  cefUROXime (CEFTIN) 250 MG tablet Take 1 tablet (250 mg total) by mouth 2 (two) times daily with a meal. 10/26/15   Vaughan Basta, MD  guaiFENesin (ROBITUSSIN) 100 MG/5ML SOLN Take 5 mLs (100 mg total) by mouth every 6 (six) hours as needed for cough or to loosen phlegm. 10/26/15   Vaughan Basta, MD  latanoprost (XALATAN) 0.005 % ophthalmic solution Place 1 drop into both eyes at bedtime.    [provider]  lisinopril-hydrochlorothiazide (PRINZIDE,ZESTORETIC) 20-12.5 MG tablet Take 2 tablets by mouth daily.  [provider]  meloxicam (MOBIC) 15 MG tablet Take 15 mg by mouth daily as needed for pain.    [provider]  oseltamivir (TAMIFLU) 75 MG capsule Take 1 capsule (75 mg total) by mouth 2 (two) times daily. 10/26/15   Vaughan Basta, MD  predniSONE (STERAPRED UNI-PAK 21 TAB) 10 MG (21) TBPK tablet Take 6 tabs first day, 5 tab on day 2, then 4 on day 3rd, 3 tabs on day 4th , 2 tab on day 5th, and 1 tab on 6th day. 10/26/15   Vaughan Basta, MD    Physical Exam: Vitals:   02/15/21 2004 02/15/21 2052 02/15/21 2137 02/15/21 2139  BP: 111/64 110/62 (!) 113/0    Pulse: 77 75 75   Resp: '16 16 16   '$ Temp: 98.6 F (37 C) 98.1 F (36.7 C) 98.6 F (37 C)   TempSrc: Oral Oral Oral   SpO2: 97%   97%  Weight:      Height:         Vitals:   02/15/21 2004 02/15/21 2052 02/15/21 2137 02/15/21 2139  BP: 111/64 110/62 (!) 113/0   Pulse: 77 75 75   Resp: '16 16 16   '$ Temp: 98.6 F (37 C) 98.1 F (36.7 C) 98.6 F (37 C)   TempSrc: Oral Oral Oral   SpO2: 97%   97%  Weight:      Height:          Constitutional: Alert and oriented x 3 . Not in any apparent distress HEENT:      Head: Normocephalic and atraumatic.         Eyes: PERLA, EOMI, Conjunctivae are normal. Sclera is non-icteric.       Mouth/Throat: Mucous membranes are moist.       Neck: Supple with no signs of meningismus. Cardiovascular: Regular rate and rhythm. No murmurs, gallops, or rubs. 2+ symmetrical distal pulses are present . No JVD. No LE edema Respiratory: Respiratory effort normal .Lungs sounds clear bilaterally. No wheezes, crackles, or rhonchi.  Gastrointestinal: Soft, non tender, and non distended with positive bowel sounds.  Genitourinary: No CVA tenderness. Musculoskeletal: Nontender with normal range of motion in all extremities. No cyanosis, or erythema of extremities. Neurologic:  Face is symmetric. Moving all extremities. No gross focal neurologic deficits . Skin: Skin is warm, dry.  No rash or ulcers Psychiatric: Mood and affect are normal    Labs on Admission: I have personally reviewed following labs and imaging studies  CBC: Recent Labs  Lab 02/15/21 1311  WBC 5.4  HGB 6.3*  HCT 18.9*  MCV 99.5  PLT [id number]*   Basic Metabolic Panel: Recent Labs  Lab 02/15/21 1311  NA 140  K 4.0  CL 106  CO2 27  GLUCOSE 184*  BUN 23  CREATININE 1.08*  CALCIUM 9.6   GFR: Estimated Creatinine Clearance: 69.6 mL/min (A) (by C-G formula based on SCr of 1.08 mg/dL (H)). Liver Function Tests: Recent Labs  Lab 02/15/21 1311  AST 109*  ALT 48*  ALKPHOS 49   BILITOT 1.7*  PROT 7.1  ALBUMIN 4.2   No results for input(s): LIPASE, AMYLASE in the last 168 hours. No results for input(s): AMMONIA in the last 168 hours. Coagulation Profile: No results for input(s): INR, PROTIME in the last 168 hours. Cardiac Enzymes: No results for input(s): CKTOTAL, CKMB, CKMBINDEX, TROPONINI in the last 168 hours. BNP (last 3 results) No results for input(s): PROBNP in the last 8760 hours. HbA1C: No results  for input(s): HGBA1C in the last 72 hours. CBG: No results for input(s): GLUCAP in the last 168 hours. Lipid Profile: No results for input(s): CHOL, HDL, LDLCALC, TRIG, CHOLHDL, LDLDIRECT in the last 72 hours. Thyroid Function Tests: No results for input(s): TSH, T4TOTAL, FREET4, T3FREE, THYROIDAB in the last 72 hours. Anemia Panel: Recent Labs    02/15/21 1311  TIBC 276  IRON 144  RETICCTPCT 2.4   Urine analysis: No results found for: COLORURINE, APPEARANCEUR, LABSPEC, PHURINE, GLUCOSEU, HGBUR, BILIRUBINUR, KETONESUR, PROTEINUR, UROBILINOGEN, NITRITE, LEUKOCYTESUR  Radiological Exams on Admission: DG Chest 1 View  Result Date: 02/15/2021 CLINICAL DATA:  Weakness and shortness of breath.  Nausea EXAM: CHEST  1 VIEW COMPARISON:  11/26/2015 FINDINGS: Elevation of the right hemidiaphragm. Heart size and pulmonary vascularity are normal for technique. Lungs are clear. No pleural effusions. No pneumothorax. Mediastinal contours appear intact. Degenerative changes in the spine and shoulders. IMPRESSION: No active disease. Electronically Signed   By: Lucienne Capers M.D.   On: 02/15/2021 17:15   CT Head Wo Contrast  Result Date: 02/15/2021 CLINICAL DATA:  Head trauma, minor (Age >= 65y) EXAM: CT HEAD WITHOUT CONTRAST TECHNIQUE: Contiguous axial images were obtained from the base of the skull through the vertex without intravenous contrast. COMPARISON:  July 23, 2005 FINDINGS: Brain: No acute infarction or hemorrhage. Postsurgical changes of the RIGHT  frontal lobe. 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 It is estimated to measure 15 x 14 mm (series 4, image 28). Vascular: No hyperdense vessel or unexpected calcification. Skull: Postsurgical changes status post resection of a mass in the RIGHT frontal lobe. No acute fracture. Sinuses/Orbits: Moderate mucosal thickening of bilateral maxillary sinuses and the sphenoid sinus. Other: None. IMPRESSION: 1. 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 Recommend further dedicated evaluation with pituitary protocol MRI with and without contrast if not previously performed. 2. Otherwise no acute intracranial abnormality. Electronically Signed   By: Valentino Saxon MD   On: 02/15/2021 17:56     Assessment/Plan 70 year old female with history of HTN, osteoarthritis on chronic NSAIDs, BMI over 50, chronic constipation, pituitary adenoma with history of radiation who follows with endocrinology, presenting with a 1 month history of weakness and hematochezia with a presyncopal episode on the day of arrival   symptomatic anemia/presyncope   Hematochezia/chronic GI blood loss    Chronic NSAID use - Presents with 1 month of weakness dizziness and a presyncopal event on the day of arrival, with hemoglobin 6.3 - Intermittent hematochezia by 1 month and history of NSAID use - Transfuse to hemoglobin over 7 - Protonix daily - N.p.o. after midnight in case of procedure in the a.m. - GI consult    Pituitary adenoma with extrasellar extension ([place]) - No acute disease.  Follows with endocrinology    Morbid obesity with BMI of 50.0-59.9, adult ([place]) - Complicating factor to overall prognosis and care    Essential hypertension - Continue home amlodipine    Osteoarthritis - Avoid NSAIDs for now  Glaucoma - Continue Xalatan    DVT prophylaxis: SCDs Code Status: full code  Family Communication:  none  Disposition Plan: Back to previous  home environment Consults called: GI Status:observation    Athena Masse MD [facility]     02/15/2021, 10:18 PM

## 2021-02-15 NOTE — ED Notes (Signed)
Blood started at 75 ml/hr for the first 15 min, patient observed for the first 15 min.  Will increase to 150 ml a hr if tolerated and no reaction during the first 15 min

## 2021-02-15 NOTE — ED Notes (Signed)
Patient tolerating the transfusion, vital signs stable.  Transfusion rate increased to 148m/hr

## 2021-02-16 ENCOUNTER — Observation Stay: Payer: Medicare HMO

## 2021-02-16 DIAGNOSIS — R945 Abnormal results of liver function studies: Secondary | ICD-10-CM

## 2021-02-16 DIAGNOSIS — D649 Anemia, unspecified: Secondary | ICD-10-CM

## 2021-02-16 LAB — URINALYSIS, COMPLETE (UACMP) WITH MICROSCOPIC
Bilirubin Urine: NEGATIVE
Glucose, UA: NEGATIVE mg/dL
Hgb urine dipstick: NEGATIVE
Ketones, ur: NEGATIVE mg/dL
Leukocytes,Ua: NEGATIVE
Nitrite: NEGATIVE
Protein, ur: NEGATIVE mg/dL
Specific Gravity, Urine: 1.016 (ref 1.005–1.030)
pH: 5 (ref 5.0–8.0)

## 2021-02-16 LAB — HEMOGLOBIN AND HEMATOCRIT, BLOOD
HCT: 20.6 % — ABNORMAL LOW (ref 36.0–46.0)
HCT: 22.4 % — ABNORMAL LOW (ref 36.0–46.0)
Hemoglobin: 7.1 g/dL — ABNORMAL LOW (ref 12.0–15.0)
Hemoglobin: 7.6 g/dL — ABNORMAL LOW (ref 12.0–15.0)

## 2021-02-16 LAB — PREPARE RBC (CROSSMATCH)

## 2021-02-16 LAB — VITAMIN B12: Vitamin B-12: <50 pg/mL — ABNORMAL LOW (ref 180–914)

## 2021-02-16 LAB — FOLATE: Folate: 26 ng/mL (ref >5.9)

## 2021-02-16 MED ORDER — PEG 3350-KCL-NA BICARB-NACL 420 G PO SOLR
4000.0000 mL | Freq: Once | ORAL | Status: DC
  Filled 2021-02-16: qty 4000

## 2021-02-16 MED ORDER — SODIUM CHLORIDE 0.9% FLUSH: 10.0000 mL | INTRAVENOUS | Status: DC | PRN

## 2021-02-16 MED ORDER — SODIUM CHLORIDE 0.9% IV SOLUTION
Freq: Once | INTRAVENOUS | Status: AC
  Filled 2021-02-16: qty 250

## 2021-02-16 MED ORDER — SODIUM CHLORIDE 0.9% FLUSH
10.0000 mL | Freq: Two times a day (BID) | INTRAVENOUS | Status: DC
  Administered 2021-02-16 – 2021-02-17 (×3): 10 mL

## 2021-02-16 MED ORDER — SODIUM CHLORIDE 0.9 % IV SOLN: INTRAVENOUS | Status: DC

## 2021-02-16 NOTE — ED Notes (Signed)
Informed RN bed assigned 

## 2021-02-16 NOTE — Consult Note (Signed)
Jonathon Bellows , MD [address] [place], [place], [place], [id number], [place], [place], [place], [address] Phone: [phone]  Fax: [phone]  Consultation  Referring Provider:     Dr Damita Dunnings Primary Care Physician:  Frazier Richards, MD Primary Gastroenterologist: None          Reason for Consultation:     Anemia   Date of Admission:  02/15/2021 Date of Consultation:  02/16/2021         HPI:   Ashley Alvarado is a 70 y.o. female with a history of HTN, osteoarthritis , chronic NSAID use , BMI> 50 presented to the ER with a history of diziness, weakness, palpitations. H/o passing bright red blood per rectum associated with a hard bowel movement for over a month .   In the ER Hb was 6.3 grams , MCV 99, iron studies show no iron deficiency. Elevated transaminases,   Denies any alcohol use, no nose bleeds, vaginal bleeds , no hematemesis, uses meloxicam/advil 2-3 times a week for many months . Says when she passes hard stools which is often , notices some blood on tissue paper. Last colonoscopy was many years back , cant recall when and was normal. No family history of colon cancer or polyps.   She says last few weeks has been having palpitations and diziness , feels that her heart has been racing, has not passed out.   Past Medical History:  Diagnosis Date   Glaucoma    Hypertension    Osteoarthritis     Past Surgical History:  Procedure Laterality Date   ABDOMINAL HYSTERECTOMY     BRAIN SURGERY     CHOLECYSTECTOMY      Prior to Admission medications   Medication Sig Start Date End Date Taking? Authorizing Provider  amLODipine (NORVASC) 10 MG tablet Take 10 mg by mouth daily.    [provider]  aspirin EC 81 MG tablet Take 81 mg by mouth daily.    [provider]  azithromycin (ZITHROMAX) 250 MG tablet Take 1 tablet (250 mg total) by mouth daily. 10/26/15   Vaughan Basta, MD  carbamide peroxide (DEBROX) 6.5 % otic solution Place 5  drops into both ears 2 (two) times daily. 10/26/15   Vaughan Basta, MD  cefUROXime (CEFTIN) 250 MG tablet Take 1 tablet (250 mg total) by mouth 2 (two) times daily with a meal. 10/26/15   Vaughan Basta, MD  guaiFENesin (ROBITUSSIN) 100 MG/5ML SOLN Take 5 mLs (100 mg total) by mouth every 6 (six) hours as needed for cough or to loosen phlegm. 10/26/15   Vaughan Basta, MD  latanoprost (XALATAN) 0.005 % ophthalmic solution Place 1 drop into both eyes at bedtime.    [provider]  lisinopril-hydrochlorothiazide (PRINZIDE,ZESTORETIC) 20-12.5 MG tablet Take 2 tablets by mouth daily.    [provider]  meloxicam (MOBIC) 15 MG tablet Take 15 mg by mouth daily as needed for pain.    [provider]  oseltamivir (TAMIFLU) 75 MG capsule Take 1 capsule (75 mg total) by mouth 2 (two) times daily. 10/26/15   Vaughan Basta, MD  predniSONE (STERAPRED UNI-PAK 21 TAB) 10 MG (21) TBPK tablet Take 6 tabs first day, 5 tab on day 2, then 4 on day 3rd, 3 tabs on day 4th , 2 tab on day 5th, and 1 tab on 6th day. 10/26/15   Vaughan Basta, MD    Family History  Problem Relation Age of Onset   Hypertension  Father    Diabetes Mother    Hypertension Mother    Kidney disease Mother    Breast cancer Neg Hx      Social History   Tobacco Use   Smoking status: Never  Substance Use Topics   Alcohol use: No    Alcohol/week: 0.0 standard drinks   Drug use: No    Allergies as of 02/15/2021   (No Known Allergies)    Review of Systems:    All systems reviewed and negative except where noted in HPI.   Physical Exam:  Vital signs in last 24 hours: Temp:  [98 F (36.7 C)-98.7 F (37.1 C)] 98.7 F (37.1 C) (07/24 2351) Pulse Rate:  [73-86] 73 (07/25 0700) Resp:  [12-20] 14 (07/25 0700) BP: (110-139)/(0-85) 139/85 (07/25 0700) SpO2:  [93 %-100 %] 100 % (07/25 0700) Weight:  [145.2 kg] 145.2 kg (07/24 1309)   General:   Pleasant, cooperative in  NAD Head:  Normocephalic and atraumatic. Eyes:   No icterus.   Conjunctiva pink. PERRLA. Ears:  Normal auditory acuity. Neck:  Supple; no masses or thyroidomegaly Lungs: Respirations even and unlabored. Lungs clear to auscultation bilaterally.   No wheezes, crackles, or rhonchi.  Heart:  Regular rate and rhythm;  Without murmur, clicks, rubs or gallops Abdomen:  Soft, nondistended, nontender. Normal bowel sounds. No appreciable masses or hepatomegaly.  No rebound or guarding.  Neurologic:  Alert and oriented x3;  grossly normal neurologically. Skin:  Intact without significant lesions or rashes. Cervical Nodes:  No significant cervical adenopathy. Psych:  Alert and cooperative. Normal affect.  LAB RESULTS: Recent Labs    02/15/21 1311 02/16/21 0041  WBC 5.4  --   HGB 6.3* 7.1*  HCT 18.9* 20.6*  PLT 107*  --    BMET Recent Labs    02/15/21 1311  NA 140  K 4.0  CL 106  CO2 27  GLUCOSE 184*  BUN 23  CREATININE 1.08*  CALCIUM 9.6   LFT Recent Labs    02/15/21 1311  PROT 7.1  ALBUMIN 4.2  AST 109*  ALT 48*  ALKPHOS 49  BILITOT 1.7*  BILIDIR 0.4*  IBILI 1.3*   PT/INR No results for input(s): LABPROT, INR in the last 72 hours.  STUDIES: DG Chest 1 View  Result Date: 02/15/2021 CLINICAL DATA:  Weakness and shortness of breath.  Nausea EXAM: CHEST  1 VIEW COMPARISON:  11/26/2015 FINDINGS: Elevation of the right hemidiaphragm. Heart size and pulmonary vascularity are normal for technique. Lungs are clear. No pleural effusions. No pneumothorax. Mediastinal contours appear intact. Degenerative changes in the spine and shoulders. IMPRESSION: No active disease. Electronically Signed   By: Lucienne Capers M.D.   On: 02/15/2021 17:15   CT Head Wo Contrast  Result Date: 02/15/2021 CLINICAL DATA:  Head trauma, minor (Age >= 65y) EXAM: CT HEAD WITHOUT CONTRAST TECHNIQUE: Contiguous axial images were obtained from the base of the skull through the vertex without intravenous  contrast. COMPARISON:  July 23, 2005 FINDINGS: Brain: No acute infarction or hemorrhage. Postsurgical changes of the RIGHT frontal lobe. 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 It is estimated to measure 15 x 14 mm (series 4, image 28). Vascular: No hyperdense vessel or unexpected calcification. Skull: Postsurgical changes status post resection of a mass in the RIGHT frontal lobe. No acute fracture. Sinuses/Orbits: Moderate mucosal thickening of bilateral maxillary sinuses and the sphenoid sinus. Other: None. IMPRESSION: 1. There is heterogeneous masslike appearance of the  pituitary gland. Recommend further dedicated evaluation with pituitary protocol MRI with and without contrast if not previously performed. 2. Otherwise no acute intracranial abnormality. Electronically Signed   By: Valentino Saxon MD   On: 02/15/2021 17:56      Impression / Plan:   Ashley Alvarado is a 70 y.o. y/o female with a history of chronic NSAID use presents to the ER with diziness, palpitations and h/o rectal bleeding with constipation , and macrocytic anemia with Hb 6.3.  Likely lower GI bleed due to chronic NSAID use and hematochezia.   Plan  Stop Long term NSAID use, BID PPI Monitor CBC and transfuse  Check b12 , folate and replace if low Colonoscopy tomorrow if cleared by cardiology for history of palpitations/dizziness and if negative will perform EGD as well.  6.   LFT's abnormal : obtain liver ultrasound  I have discussed alternative options, risks & benefits,  which include, but are not limited to, bleeding, infection, perforation,respiratory complication & drug reaction.  The patient agrees with this plan & written consent will be obtained.     Thank you for involving me in the care of this patient.      LOS: 0 days   Jonathon Bellows, MD  02/16/2021, 7:33 AM

## 2021-02-16 NOTE — ED Notes (Signed)
Patient has periods of sleep apnea, patient placed on 2 liters of oxygen [place] as per order

## 2021-02-16 NOTE — Progress Notes (Addendum)
PROGRESS NOTE    Ashley Alvarado  [id number] DOB: [birth year] DOA: 02/15/2021 PCP: Frazier Richards, MD  106A/106A-AA   Assessment & Plan:   Active Problems:   Symptomatic anemia   Hematochezia   Constipation   Pituitary adenoma with extrasellar extension (HCC)   Morbid obesity with BMI of 50.0-59.9, adult (HCC)   Essential hypertension   Osteoarthritis   Ashley Alvarado is a 70 y.o. female with medical history significant for HTN, osteoarthritis on chronic NSAIDs, BMI over 50, chronic constipation, pituitary adenoma with history of radiation who follows with endocrinology, who presents to the ED with a 1 month history of weakness and of late palpitations, lightheadedness until today when she felt dizzy and started seeing black spots and felt like she was about to pass out and decided to come into the ED to get checked out.  States for the past month she has been seeing bright red blood in her stool intermittently especially when she strained to have a bowel movement.  States she had a normal colonoscopy about 5 years ago.  Has nausea but without vomiting.  Takes NSAIDs about 3 times a week for osteoarthritic pain.  Has occasional acid reflux symptoms.   Symptomatic anemia Presyncope and palpitation 2/2 anemia - Presents with 1 month of weakness dizziness and a presyncopal event on the day of arrival, with hemoglobin 6.3. --anemia workup showed vit B12 <50.  Iron and folate wnl. --s/p 1u pRBC Plan: --2nd unit of pRBC today --Hgb BID --start Vit B12 supplement  Hematochezia Chronic NSAID use - Intermittent hematochezia by 1 month and history of NSAID use Plan: --clear liquid diet and bowel prep today --cont IV PPI --pt medically cleared to undergo EGD and colonoscopy tomorrow     Pituitary adenoma with extrasellar extension (HCC) - No acute disease.  Follows with endocrinology     Morbid obesity with BMI of 50.0-59.9, adult (HCC) - Complicating factor to overall  prognosis and care     Essential hypertension - hold home amlodipine      Osteoarthritis - avoid NSAIDs for now   Glaucoma - Continue Xalatan   DVT prophylaxis: SCD/Compression stockings Code Status: Full code  Family Communication: granddaughter updated at bedside today Level of care: Med-Surg Dispo:   The patient is from: home Anticipated d/c is to: home Anticipated d/c date is: 1-2 days Patient currently is not medically ready to d/c due to: pending EGD and colonoscopy   Subjective and Interval History:  Pt received 1u pRBC, reported feeling better.  Reported stool with blood in it.  No abdominal pain.   Objective: Vitals:   02/16/21 1308 02/16/21 1637 02/16/21 1952 02/16/21 2317  BP: 135/87 140/71 136/79 120/71  Pulse: 66 68 72 78  Resp: '17 17 16 15  '$ Temp:   98.5 F (36.9 C) 97.9 F (36.6 C)  TempSrc:   Oral Oral  SpO2:  98% 99% 96%  Weight:      Height:        Intake/Output Summary (Last 24 hours) at 02/17/2021 0122 Last data filed at 02/16/2021 2027 Gross per 24 hour  Intake 1081.67 ml  Output --  Net 1081.67 ml   Filed Weights   02/15/21 1309  Weight: (!) 145.2 kg    Examination:   Constitutional: NAD, AAOx3 HEENT: conjunctivae and lids normal, EOMI CV: No cyanosis.   RESP: normal respiratory effort, on RA Extremities: No effusions, edema in BLE SKIN: warm, dry Neuro: II - XII grossly intact.  Psych: Normal mood and affect.  Appropriate judgement and reason   Data Reviewed: I have personally reviewed following labs and imaging studies  CBC: Recent Labs  Lab 02/15/21 1311 02/16/21 0041 02/16/21 2041  WBC 5.4  --   --   HGB 6.3* 7.1* 7.6*  HCT 18.9* 20.6* 22.4*  MCV 99.5  --   --   PLT 107*  --   --    Basic Metabolic Panel: Recent Labs  Lab 02/15/21 1311  NA 140  K 4.0  CL 106  CO2 27  GLUCOSE 184*  BUN 23  CREATININE 1.08*  CALCIUM 9.6   GFR: Estimated Creatinine Clearance: 69.6 mL/min (A) (by C-G formula based on SCr  of 1.08 mg/dL (H)). Liver Function Tests: Recent Labs  Lab 02/15/21 1311  AST 109*  ALT 48*  ALKPHOS 49  BILITOT 1.7*  PROT 7.1  ALBUMIN 4.2   No results for input(s): LIPASE, AMYLASE in the last 168 hours. No results for input(s): AMMONIA in the last 168 hours. Coagulation Profile: No results for input(s): INR, PROTIME in the last 168 hours. Cardiac Enzymes: No results for input(s): CKTOTAL, CKMB, CKMBINDEX, TROPONINI in the last 168 hours. BNP (last 3 results) No results for input(s): PROBNP in the last 8760 hours. HbA1C: No results for input(s): HGBA1C in the last 72 hours. CBG: No results for input(s): GLUCAP in the last 168 hours. Lipid Profile: No results for input(s): CHOL, HDL, LDLCALC, TRIG, CHOLHDL, LDLDIRECT in the last 72 hours. Thyroid Function Tests: No results for input(s): TSH, T4TOTAL, FREET4, T3FREE, THYROIDAB in the last 72 hours. Anemia Panel: Recent Labs    02/15/21 1311 02/16/21 0820  VITAMINB12  --  <50*  FOLATE  --  26.0  TIBC 276  --   IRON 144  --   RETICCTPCT 2.4  --    Sepsis Labs: No results for input(s): PROCALCITON, LATICACIDVEN in the last 168 hours.  Recent Results (from the past 240 hour(s))  Resp Panel by RT-PCR (Flu A&B, Covid) Nasopharyngeal Swab     Status: None   Collection Time: 02/15/21  5:14 PM   Specimen: Nasopharyngeal Swab; Nasopharyngeal(NP) swabs in vial transport medium  Result Value Ref Range Status   SARS Coronavirus 2 by RT PCR NEGATIVE NEGATIVE Final    Comment: (NOTE) SARS-CoV-2 target nucleic acids are NOT DETECTED.  The SARS-CoV-2 RNA is generally detectable in upper respiratory specimens during the acute phase of infection. The lowest concentration of SARS-CoV-2 viral copies this assay can detect is 138 copies/mL. A negative result does not preclude SARS-Cov-2 infection and should not be used as the sole basis for treatment or other patient management decisions. A negative result may occur with  improper  specimen collection/handling, submission of specimen other than nasopharyngeal swab, presence of viral mutation(s) within the areas targeted by this assay, and inadequate number of viral copies(<138 copies/mL). A negative result must be combined with clinical observations, patient history, and epidemiological information. The expected result is Negative.  Fact Sheet for Patients:  EntrepreneurPulse.com.au  Fact Sheet for Healthcare Providers:  IncredibleEmployment.be  This test is no t yet approved or cleared by the [place] FDA and  has been authorized for detection and/or diagnosis of SARS-CoV-2 by FDA under an Emergency Use Authorization (EUA). This EUA will remain  in effect (meaning this test can be used) for the duration of the COVID-19 declaration under Section 564(b)(1) of the Act, 21 U.S.C.section 360bbb-3(b)(1), unless the authorization is terminated  or revoked sooner.  Influenza A by PCR NEGATIVE NEGATIVE Final   Influenza B by PCR NEGATIVE NEGATIVE Final    Comment: (NOTE) The Xpert Xpress SARS-CoV-2/FLU/RSV plus assay is intended as an aid in the diagnosis of influenza from Nasopharyngeal swab specimens and should not be used as a sole basis for treatment. Nasal washings and aspirates are unacceptable for Xpert Xpress SARS-CoV-2/FLU/RSV testing.  Fact Sheet for Patients: EntrepreneurPulse.com.au  Fact Sheet for Healthcare Providers: IncredibleEmployment.be  This test is not yet approved or cleared by the [place] FDA and has been authorized for detection and/or diagnosis of SARS-CoV-2 by FDA under an Emergency Use Authorization (EUA). This EUA will remain in effect (meaning this test can be used) for the duration of the COVID-19 declaration under Section 564(b)(1) of the Act, 21 U.S.C. section 360bbb-3(b)(1), unless the authorization is terminated or revoked.  Performed at  [facility], [address] [place], [place] [address]       Radiology Studies: DG Chest 1 View  Result Date: 02/15/2021 CLINICAL DATA:  Weakness and shortness of breath.  Nausea EXAM: CHEST  1 VIEW COMPARISON:  11/26/2015 FINDINGS: Elevation of the right hemidiaphragm. Heart size and pulmonary vascularity are normal for technique. Lungs are clear. No pleural effusions. No pneumothorax. Mediastinal contours appear intact. Degenerative changes in the spine and shoulders. IMPRESSION: No active disease. Electronically Signed   By: Lucienne Capers M.D.   On: 02/15/2021 17:15   CT Head Wo Contrast  Result Date: 02/15/2021 CLINICAL DATA:  Head trauma, minor (Age >= 65y) EXAM: CT HEAD WITHOUT CONTRAST TECHNIQUE: Contiguous axial images were obtained from the base of the skull through the vertex without intravenous contrast. COMPARISON:  July 23, 2005 FINDINGS: Brain: No acute infarction or hemorrhage. Postsurgical changes of the RIGHT frontal lobe. 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 It is estimated to measure 15 x 14 mm (series 4, image 28). Vascular: No hyperdense vessel or unexpected calcification. Skull: Postsurgical changes status post resection of a mass in the RIGHT frontal lobe. No acute fracture. Sinuses/Orbits: Moderate mucosal thickening of bilateral maxillary sinuses and the sphenoid sinus. Other: None. IMPRESSION: 1. 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 Recommend further dedicated evaluation with pituitary protocol MRI with and without contrast if not previously performed. 2. Otherwise no acute intracranial abnormality. Electronically Signed   By: Valentino Saxon MD   On: 02/15/2021 17:56   US Abdomen Limited RUQ (LIVER/GB)  Result Date: 02/16/2021 CLINICAL DATA:  Abnormal liver function tests EXAM: ULTRASOUND ABDOMEN LIMITED RIGHT UPPER QUADRANT COMPARISON:  None. FINDINGS: Gallbladder:  History of cholecystectomy Common bile duct: Diameter: 1 cm.  Partial coverage shows no visible filling defect. Liver: No focal lesion identified. Within normal limits in parenchymal echogenicity. Portal vein is patent on color Doppler imaging with normal direction of blood flow towards the liver. IMPRESSION: 1. Mildly dilated CBD which is usually normal after cholecystectomy. 2. Unremarkable appearance of the liver. Electronically Signed   By: Monte Fantasia M.D.   On: 02/16/2021 08:38     Scheduled Meds:  sodium chloride   Intravenous Once   pantoprazole (PROTONIX) IV  40 mg Intravenous Q24H   polyethylene glycol-electrolytes  4,000 mL Oral Once   sodium chloride flush  10-40 mL Intracatheter Q12H   Continuous Infusions:  sodium chloride Stopped (02/16/21 2027)     LOS: 0 days     Enzo Bi, MD [facility] If 7PM-7AM, please contact night-coverage 02/17/2021, 1:22 AM

## 2021-02-17 DIAGNOSIS — K921 Melena: Secondary | ICD-10-CM | POA: Diagnosis not present

## 2021-02-17 LAB — TYPE AND SCREEN
ABO/RH(D): O POS
Antibody Screen: NEGATIVE
Unit division: 0
Unit division: 0

## 2021-02-17 LAB — CBC
HCT: 21.6 % — ABNORMAL LOW (ref 36.0–46.0)
Hemoglobin: 7.4 g/dL — ABNORMAL LOW (ref 12.0–15.0)
MCH: 32 pg (ref 26.0–34.0)
MCHC: 34.3 g/dL (ref 30.0–36.0)
MCV: 93.5 fL (ref 80.0–100.0)
Platelets: 83 10*3/uL — ABNORMAL LOW (ref 150–400)
RBC: 2.31 MIL/uL — ABNORMAL LOW (ref 3.87–5.11)
RDW: 33.1 % — ABNORMAL HIGH (ref 11.5–15.5)
WBC: 3.3 10*3/uL — ABNORMAL LOW (ref 4.0–10.5)
nRBC: 1.5 % — ABNORMAL HIGH (ref 0.0–0.2)

## 2021-02-17 LAB — BPAM RBC
Blood Product Expiration Date: 202208222359
Blood Product Expiration Date: 202208262359
ISSUE DATE / TIME: 202207241928
ISSUE DATE / TIME: 202207251008
Unit Type and Rh: 5100
Unit Type and Rh: 5100

## 2021-02-17 LAB — BASIC METABOLIC PANEL
Anion gap: 6 (ref 5–15)
BUN: 22 mg/dL (ref 8–23)
CO2: 29 mmol/L (ref 22–32)
Calcium: 9.1 mg/dL (ref 8.9–10.3)
Chloride: 104 mmol/L (ref 98–111)
Creatinine, Ser: 1.03 mg/dL — ABNORMAL HIGH (ref 0.44–1.00)
GFR, Estimated: 58 mL/min — ABNORMAL LOW (ref >60)
Glucose, Bld: 152 mg/dL — ABNORMAL HIGH (ref 70–99)
Potassium: 3.6 mmol/L (ref 3.5–5.1)
Sodium: 139 mmol/L (ref 135–145)

## 2021-02-17 LAB — MAGNESIUM: Magnesium: 2 mg/dL (ref 1.7–2.4)

## 2021-02-17 MED ORDER — PEG 3350-KCL-NA BICARB-NACL 420 G PO SOLR
4000.0000 mL | Freq: Once | ORAL | Status: AC
  Administered 2021-02-17: 4000 mL via ORAL
  Filled 2021-02-17: qty 4000

## 2021-02-17 MED ORDER — CYANOCOBALAMIN 1000 MCG/ML IJ SOLN
1000.0000 ug | Freq: Once | INTRAMUSCULAR | Status: AC
  Administered 2021-02-17: 1000 ug via SUBCUTANEOUS
  Filled 2021-02-17: qty 1

## 2021-02-17 NOTE — TOC Initial Note (Signed)
Transition of Care [facility]) - Initial/Assessment Note    Patient Details  Name: Ashley Alvarado MRN: [id number] Date of Birth: 07/07/1951  [facility]) CM/SW Contact:    Shelbie Hutching, RN Phone Number: 02/17/2021, 11:06 AM  Clinical Narrative:                 Patient placed under observation for symptomatic anemia.  RNCM met with patient at the bedside to review MOON.  Patient reports that she is from home, her son Ashley Alvarado lives with her.  Patient is independent at home and drives.  She usually does not need a walker but does have one for when she has to walk long distances.  Patient is current with her PCP at [facility], her prescriptions are delivered by [facility].  Patient will go for colonoscopy tomorrow she will complete the prep today.   TOC will follow for needs.   Expected Discharge Plan: Home/Self Care Barriers to Discharge: Continued Medical Work up   Patient Goals and CMS Choice Patient states their goals for this hospitalization and ongoing recovery are:: Patient would like to return home at discharge- getting colonoscopy tomorrow      Expected Discharge Plan and Services Expected Discharge Plan: Home/Self Care       Living arrangements for the past 2 months: Single Family Home                 DME Arranged: N/A DME Agency: NA       HH Arranged: NA          Prior Living Arrangements/Services Living arrangements for the past 2 months: Single Family Home Lives with:: Adult Children (son, Ashley Alvarado) Patient language and need for interpreter reviewed:: Yes Do you feel safe going back to the place where you live?: Yes      Need for Family Participation in Patient Care: Yes (Comment) (anemia) Care giver support system in place?: Yes (comment) (son) Current home services: DME (walker) Criminal Activity/Legal Involvement Pertinent to Current Situation/Hospitalization: No - Comment as needed  Activities of Daily Living Home Assistive Devices/Equipment:  None ADL Screening (condition at time of admission) Patient's cognitive ability adequate to safely complete daily activities?: Yes Is the patient deaf or have difficulty hearing?: No Does the patient have difficulty seeing, even when wearing glasses/contacts?: No Does the patient have difficulty concentrating, remembering, or making decisions?: No Patient able to express need for assistance with ADLs?: Yes Does the patient have difficulty dressing or bathing?: No Independently performs ADLs?: Yes (appropriate for developmental age) Does the patient have difficulty walking or climbing stairs?: No Weakness of Legs: None Weakness of Arms/Hands: None  Permission Sought/Granted   Permission granted to share information with : No              Emotional Assessment Appearance:: Appears stated age Attitude/Demeanor/Rapport: Engaged Affect (typically observed): Accepting Orientation: : Oriented to Self, Oriented to Place, Oriented to  Time, Oriented to Situation Alcohol / Substance Use: Not Applicable Psych Involvement: No (comment)  Admission diagnosis:  Abnormal LFTs [R94.5] Symptomatic anemia [D64.9] Patient Active Problem List   Diagnosis Date Noted   Symptomatic anemia 02/15/2021   Hematochezia 02/15/2021   Constipation 02/15/2021   Morbid obesity with BMI of 50.0-59.9, adult ([place]) 02/15/2021   Essential hypertension 02/15/2021   Osteoarthritis 02/15/2021   Acute respiratory failure with hypoxia ([place]) 10/24/2015   Pituitary adenoma with extrasellar extension ([place]) 03/21/2006   PCP:  Frazier Richards, MD Pharmacy:   [facility] [place], [place] - [address] [place] [address] Phone: [phone] Fax: [phone]     Social Determinants of Health (SDOH) Interventions    Readmission Risk Interventions No flowsheet data found.

## 2021-02-17 NOTE — Plan of Care (Addendum)
Pt admitted at 2000 overnight from ED, in no acute distress. VSS. X1 BM w/no blood present. Midline placed by IV RN d/t difficult access. Ambulated to bathroom with use of walker, gait steady. Rounding performed.  Golytely prep not due until 7/26 0800, not noticed until nearing end of shift since med not due during shift. MD Damita Dunnings notified and sent chat to GI MD Vicente Males about whether or not to start prep now or reschedule to tonight. Pharmacy aware as well, stating med was sent to ED and not sure why pt didn't receive.    Problem: Education: Goal: Ability to identify signs and symptoms of gastrointestinal bleeding will improve 02/17/2021 0328 by Jocelyn Lamer, RN Outcome: Progressing 02/16/2021 2248 by Jocelyn Lamer, RN Outcome: Progressing   Problem: Bowel/Gastric: Goal: Will show no signs and symptoms of gastrointestinal bleeding 02/17/2021 0328 by Jocelyn Lamer, RN Outcome: Progressing 02/16/2021 2248 by Jocelyn Lamer, RN Outcome: Progressing   Problem: Fluid Volume: Goal: Will show no signs and symptoms of excessive bleeding 02/17/2021 0328 by Jocelyn Lamer, RN Outcome: Progressing 02/16/2021 2248 by Jocelyn Lamer, RN Outcome: Progressing   Problem: Clinical Measurements: Goal: Complications related to the disease process, condition or treatment will be avoided or minimized 02/17/2021 0328 by Jocelyn Lamer, RN Outcome: Progressing 02/16/2021 2248 by Jocelyn Lamer, RN Outcome: Progressing

## 2021-02-17 NOTE — Progress Notes (Signed)
GI note  Patient wasn't given bowel prep that was ordered yesterday . Hence procedure rescheduled to tomorrow morning 7.30 am , please commence bowel prep today at 5 pm    Dr Jonathon Bellows MD,MRCP [facility]) Gastroenterology/Hepatology Pager: [phone]

## 2021-02-17 NOTE — Care Management Obs Status (Signed)
[place] NOTIFICATION   Patient Details  Name: Ashley Alvarado MRN: [id number] Date of Birth: 04/18/1951   Medicare Observation Status Notification Given:  Yes    Shelbie Hutching, RN 02/17/2021, 10:07 AM

## 2021-02-17 NOTE — Progress Notes (Signed)
PROGRESS NOTE    Ashley Alvarado  [id number] DOB: 03-03-1951 DOA: 02/15/2021 PCP: Frazier Richards, MD  106A/106A-AA   Assessment & Plan:   Active Problems:   Symptomatic anemia   Hematochezia   Constipation   Pituitary adenoma with extrasellar extension (HCC)   Morbid obesity with BMI of 50.0-59.9, adult (HCC)   Essential hypertension   Osteoarthritis   Ashley Alvarado is a 70 y.o. female with medical history significant for HTN, osteoarthritis on chronic NSAIDs, BMI over 50, chronic constipation, pituitary adenoma with history of radiation who follows with endocrinology, who presents to the ED with a 1 month history of weakness and of late palpitations, lightheadedness until today when she felt dizzy and started seeing black spots and felt like she was about to pass out and decided to come into the ED to get checked out.  States for the past month she has been seeing bright red blood in her stool intermittently especially when she strained to have a bowel movement.  States she had a normal colonoscopy about 5 years ago.  Has nausea but without vomiting.  Takes NSAIDs about 3 times a week for osteoarthritic pain.  Has occasional acid reflux symptoms.   Symptomatic anemia Presyncope and palpitation 2/2 anemia Severe vit B12 def - Presents with 1 month of weakness dizziness and a presyncopal event on the day of arrival, with hemoglobin 6.3. --anemia workup showed vit B12 <50.  Iron and folate wnl. --s/p 2u pRBC Plan: --IV vit B12 x1 today  Hematochezia Chronic NSAID use - Intermittent hematochezia by 1 month and history of NSAID use --did not receive bowel prep as scheduled yesterday Plan: --clear liquid diet and bowel prep today --cont IV PPI --EGD and colonoscopy tomorrow     Pituitary adenoma with extrasellar extension (HCC) - No acute disease.  Follows with endocrinology     Morbid obesity with BMI of 50.0-59.9, adult (HCC) - Complicating factor to overall  prognosis and care     Essential hypertension --hold home amlodipine for now     Osteoarthritis --avoid NSAIDs   Glaucoma - Continue Xalatan   DVT prophylaxis: SCD/Compression stockings Code Status: Full code  Family Communication: sisters updated at bedside today Level of care: Med-Surg Dispo:   The patient is from: home Anticipated d/c is to: home Anticipated d/c date is: 1-2 days Patient currently is not medically ready to d/c due to: pending EGD and colonoscopy   Subjective and Interval History:  Bowel prep ordered to start at 5 pm yesterday, but somehow it was re-scheduled to next day, so pt didn't receive bowel prep and couldn't go for her scheduled EGD and colonoscopy today.  Pt reported 1 BM for the past day, with no blood seen.  No abdominal pain.  Hgb stable after 2u pRBC.    Objective: Vitals:   02/17/21 0429 02/17/21 0713 02/17/21 1120 02/17/21 1536  BP: 124/77 127/71 130/70 130/70  Pulse: 78 81 80 72  Resp: '16 20 16 18  '$ Temp: 99.1 F (37.3 C) 98.4 F (36.9 C) 98.6 F (37 C) 98.5 F (36.9 C)  TempSrc: Oral Oral Oral Oral  SpO2: 95% 95% 100% 100%  Weight:      Height:        Intake/Output Summary (Last 24 hours) at 02/17/2021 1821 Last data filed at 02/17/2021 1030 Gross per 24 hour  Intake 801.67 ml  Output --  Net 801.67 ml   Filed Weights   02/15/21 1309  Weight: (!) 145.2  kg    Examination:   Constitutional: NAD, AAOx3 HEENT: conjunctivae and lids normal, EOMI CV: No cyanosis.   RESP: normal respiratory effort, on RA Extremities: No effusions, edema in BLE SKIN: warm, dry Neuro: II - XII grossly intact.   Psych: Normal mood and affect.  Appropriate judgement and reason   Data Reviewed: I have personally reviewed following labs and imaging studies  CBC: Recent Labs  Lab 02/15/21 1311 02/16/21 0041 02/16/21 2041 02/17/21 0430  WBC 5.4  --   --  3.3*  HGB 6.3* 7.1* 7.6* 7.4*  HCT 18.9* 20.6* 22.4* 21.6*  MCV 99.5  --   --   93.5  PLT 107*  --   --  83*   Basic Metabolic Panel: Recent Labs  Lab 02/15/21 1311 02/17/21 0430  NA 140 139  K 4.0 3.6  CL 106 104  CO2 27 29  GLUCOSE 184* 152*  BUN 23 22  CREATININE 1.08* 1.03*  CALCIUM 9.6 9.1  MG  --  2.0   GFR: Estimated Creatinine Clearance: 73 mL/min (A) (by C-G formula based on SCr of 1.03 mg/dL (H)). Liver Function Tests: Recent Labs  Lab 02/15/21 1311  AST 109*  ALT 48*  ALKPHOS 49  BILITOT 1.7*  PROT 7.1  ALBUMIN 4.2   No results for input(s): LIPASE, AMYLASE in the last 168 hours. No results for input(s): AMMONIA in the last 168 hours. Coagulation Profile: No results for input(s): INR, PROTIME in the last 168 hours. Cardiac Enzymes: No results for input(s): CKTOTAL, CKMB, CKMBINDEX, TROPONINI in the last 168 hours. BNP (last 3 results) No results for input(s): PROBNP in the last 8760 hours. HbA1C: No results for input(s): HGBA1C in the last 72 hours. CBG: No results for input(s): GLUCAP in the last 168 hours. Lipid Profile: No results for input(s): CHOL, HDL, LDLCALC, TRIG, CHOLHDL, LDLDIRECT in the last 72 hours. Thyroid Function Tests: No results for input(s): TSH, T4TOTAL, FREET4, T3FREE, THYROIDAB in the last 72 hours. Anemia Panel: Recent Labs    02/15/21 1311 02/16/21 0820  VITAMINB12  --  <50*  FOLATE  --  26.0  TIBC 276  --   IRON 144  --   RETICCTPCT 2.4  --    Sepsis Labs: No results for input(s): PROCALCITON, LATICACIDVEN in the last 168 hours.  Recent Results (from the past 240 hour(s))  Resp Panel by RT-PCR (Flu A&B, Covid) Nasopharyngeal Swab     Status: None   Collection Time: 02/15/21  5:14 PM   Specimen: Nasopharyngeal Swab; Nasopharyngeal(NP) swabs in vial transport medium  Result Value Ref Range Status   SARS Coronavirus 2 by RT PCR NEGATIVE NEGATIVE Final    Comment: (NOTE) SARS-CoV-2 target nucleic acids are NOT DETECTED.  The SARS-CoV-2 RNA is generally detectable in upper  respiratory specimens during the acute phase of infection. The lowest concentration of SARS-CoV-2 viral copies this assay can detect is 138 copies/mL. A negative result does not preclude SARS-Cov-2 infection and should not be used as the sole basis for treatment or other patient management decisions. A negative result may occur with  improper specimen collection/handling, submission of specimen other than nasopharyngeal swab, presence of viral mutation(s) within the areas targeted by this assay, and inadequate number of viral copies(<138 copies/mL). A negative result must be combined with clinical observations, patient history, and epidemiological information. The expected result is Negative.  Fact Sheet for Patients:  EntrepreneurPulse.com.au  Fact Sheet for Healthcare Providers:  IncredibleEmployment.be  This test is no  t yet approved or cleared by the [place] and  has been authorized for detection and/or diagnosis of SARS-CoV-2 by FDA under an Emergency Use Authorization (EUA). This EUA will remain  in effect (meaning this test can be used) for the duration of the COVID-19 declaration under Section 564(b)(1) of the Act, 21 U.S.C.section 360bbb-3(b)(1), unless the authorization is terminated  or revoked sooner.       Influenza A by PCR NEGATIVE NEGATIVE Final   Influenza B by PCR NEGATIVE NEGATIVE Final    Comment: (NOTE) The Xpert Xpress SARS-CoV-2/FLU/RSV plus assay is intended as an aid in the diagnosis of influenza from Nasopharyngeal swab specimens and should not be used as a sole basis for treatment. Nasal washings and aspirates are unacceptable for Xpert Xpress SARS-CoV-2/FLU/RSV testing.  Fact Sheet for Patients: EntrepreneurPulse.com.au  Fact Sheet for Healthcare Providers: IncredibleEmployment.be  This test is not yet approved or cleared by the [place] FDA and has been  authorized for detection and/or diagnosis of SARS-CoV-2 by FDA under an Emergency Use Authorization (EUA). This EUA will remain in effect (meaning this test can be used) for the duration of the COVID-19 declaration under Section 564(b)(1) of the Act, 21 U.S.C. section 360bbb-3(b)(1), unless the authorization is terminated or revoked.  Performed at [facility], [address] [place], [place] [address]       Radiology Studies: US Abdomen Limited RUQ (LIVER/GB)  Result Date: 02/16/2021 CLINICAL DATA:  Abnormal liver function tests EXAM: ULTRASOUND ABDOMEN LIMITED RIGHT UPPER QUADRANT COMPARISON:  None. FINDINGS: Gallbladder: History of cholecystectomy Common bile duct: Diameter: 1 cm.  Partial coverage shows no visible filling defect. Liver: No focal lesion identified. Within normal limits in parenchymal echogenicity. Portal vein is patent on color Doppler imaging with normal direction of blood flow towards the liver. IMPRESSION: 1. Mildly dilated CBD which is usually normal after cholecystectomy. 2. Unremarkable appearance of the liver. Electronically Signed   By: Monte Fantasia M.D.   On: 02/16/2021 08:38     Scheduled Meds:  sodium chloride   Intravenous Once   cyanocobalamin  1,000 mcg Subcutaneous Once   pantoprazole (PROTONIX) IV  40 mg Intravenous Q24H   sodium chloride flush  10-40 mL Intracatheter Q12H   Continuous Infusions:  sodium chloride Stopped (02/16/21 2027)     LOS: 0 days     Enzo Bi, MD [facility] If 7PM-7AM, please contact night-coverage 02/17/2021, 6:21 PM

## 2021-02-18 ENCOUNTER — Observation Stay: Payer: Medicare HMO | Admitting: Certified Registered"

## 2021-02-18 ENCOUNTER — Encounter
Admission: EM | Disposition: A | Discharge status: Home / Self Care | Payer: Self-pay | Source: Home / Self Care | Attending: Emergency Medicine

## 2021-02-18 ENCOUNTER — Encounter: Payer: Self-pay | Admitting: Internal Medicine

## 2021-02-18 DIAGNOSIS — K3189 Other diseases of stomach and duodenum: Secondary | ICD-10-CM

## 2021-02-18 DIAGNOSIS — K317 Polyp of stomach and duodenum: Secondary | ICD-10-CM

## 2021-02-18 DIAGNOSIS — D500 Iron deficiency anemia secondary to blood loss (chronic): Secondary | ICD-10-CM

## 2021-02-18 DIAGNOSIS — D649 Anemia, unspecified: Secondary | ICD-10-CM | POA: Diagnosis not present

## 2021-02-18 DIAGNOSIS — K635 Polyp of colon: Secondary | ICD-10-CM | POA: Diagnosis not present

## 2021-02-18 HISTORY — PX: ESOPHAGOGASTRODUODENOSCOPY (EGD) WITH PROPOFOL: SHX5813

## 2021-02-18 HISTORY — PX: COLONOSCOPY: SHX5424

## 2021-02-18 LAB — CBC
HCT: 23.1 % — ABNORMAL LOW (ref 36.0–46.0)
Hemoglobin: 7.8 g/dL — ABNORMAL LOW (ref 12.0–15.0)
MCH: 31.8 pg (ref 26.0–34.0)
MCHC: 33.8 g/dL (ref 30.0–36.0)
MCV: 94.3 fL (ref 80.0–100.0)
Platelets: 107 10*3/uL — ABNORMAL LOW (ref 150–400)
RBC: 2.45 MIL/uL — ABNORMAL LOW (ref 3.87–5.11)
RDW: 32 % — ABNORMAL HIGH (ref 11.5–15.5)
WBC: 4.2 10*3/uL (ref 4.0–10.5)
nRBC: 0.7 % — ABNORMAL HIGH (ref 0.0–0.2)

## 2021-02-18 LAB — BASIC METABOLIC PANEL
Anion gap: 8 (ref 5–15)
BUN: 19 mg/dL (ref 8–23)
CO2: 28 mmol/L (ref 22–32)
Calcium: 9.1 mg/dL (ref 8.9–10.3)
Chloride: 102 mmol/L (ref 98–111)
Creatinine, Ser: 1.09 mg/dL — ABNORMAL HIGH (ref 0.44–1.00)
GFR, Estimated: 54 mL/min — ABNORMAL LOW (ref >60)
Glucose, Bld: 174 mg/dL — ABNORMAL HIGH (ref 70–99)
Potassium: 3.8 mmol/L (ref 3.5–5.1)
Sodium: 138 mmol/L (ref 135–145)

## 2021-02-18 LAB — MAGNESIUM: Magnesium: 2 mg/dL (ref 1.7–2.4)

## 2021-02-18 SURGERY — COLONOSCOPY
Anesthesia: General

## 2021-02-18 MED ORDER — PHENYLEPHRINE HCL (PRESSORS) 10 MG/ML IV SOLN
INTRAVENOUS | Status: DC | PRN
  Administered 2021-02-18: 100 ug via INTRAVENOUS

## 2021-02-18 MED ORDER — AMLODIPINE BESYLATE 10 MG PO TABS: ORAL_TABLET | ORAL | Status: AC

## 2021-02-18 MED ORDER — HYDROCHLOROTHIAZIDE 25 MG PO TABS: ORAL_TABLET | ORAL | Status: AC

## 2021-02-18 MED ORDER — GLYCOPYRROLATE 0.2 MG/ML IJ SOLN
INTRAMUSCULAR | Status: DC | PRN
  Administered 2021-02-18: .2 mg via INTRAVENOUS

## 2021-02-18 MED ORDER — LOSARTAN POTASSIUM 100 MG PO TABS: ORAL_TABLET | ORAL | Status: AC

## 2021-02-18 MED ORDER — PROPOFOL 10 MG/ML IV BOLUS
INTRAVENOUS | Status: DC | PRN
  Administered 2021-02-18: 30 mg via INTRAVENOUS
  Administered 2021-02-18: 70 mg via INTRAVENOUS

## 2021-02-18 MED ORDER — PROPOFOL 500 MG/50ML IV EMUL
INTRAVENOUS | Status: DC | PRN
  Administered 2021-02-18: 120 ug/kg/min via INTRAVENOUS

## 2021-02-18 MED ORDER — MIDAZOLAM HCL 2 MG/2ML IJ SOLN
INTRAMUSCULAR | Status: AC
  Filled 2021-02-18: qty 2

## 2021-02-18 MED ORDER — PANTOPRAZOLE SODIUM 40 MG PO TBEC: 40.0000 mg | DELAYED_RELEASE_TABLET | Freq: Every day | ORAL | 0 refills | Status: DC

## 2021-02-18 MED ORDER — EPHEDRINE SULFATE 50 MG/ML IJ SOLN
INTRAMUSCULAR | Status: DC | PRN
  Administered 2021-02-18: 10 mg via INTRAVENOUS

## 2021-02-18 MED ORDER — LIDOCAINE HCL (PF) 2 % IJ SOLN
INTRAMUSCULAR | Status: AC
  Filled 2021-02-18: qty 5

## 2021-02-18 MED ORDER — LIDOCAINE 2% (20 MG/ML) 5 ML SYRINGE
INTRAMUSCULAR | Status: DC | PRN
  Administered 2021-02-18: 25 mg via INTRAVENOUS

## 2021-02-18 MED ORDER — VITAMIN B-12 1000 MCG PO TABS
1000.0000 ug | ORAL_TABLET | Freq: Every day | ORAL | Status: DC
Start: 2021-02-18 — End: 2021-05-26

## 2021-02-18 MED ORDER — EPHEDRINE 5 MG/ML INJ
INTRAVENOUS | Status: AC
  Filled 2021-02-18: qty 5

## 2021-02-18 MED ORDER — PHENYLEPHRINE HCL (PRESSORS) 10 MG/ML IV SOLN
INTRAVENOUS | Status: AC
  Filled 2021-02-18: qty 1

## 2021-02-18 MED ORDER — PROPOFOL 500 MG/50ML IV EMUL
INTRAVENOUS | Status: AC
  Filled 2021-02-18: qty 100

## 2021-02-18 MED ORDER — GLYCOPYRROLATE 0.2 MG/ML IJ SOLN
INTRAMUSCULAR | Status: AC
  Filled 2021-02-18: qty 1

## 2021-02-18 NOTE — Progress Notes (Signed)
Received Md order to discharge patient to home, reviewed discharge instructions, prescriptions, follow up appointments and homes meds with patient and patient verbalized understanding

## 2021-02-18 NOTE — Op Note (Signed)
[facility] Gastroenterology Patient Name: Ashley Alvarado Procedure Date: 02/18/2021 7:47 AM MRN: [id number] Account #: [id number] Date of Birth: 09/12/1950 Admit Type: Inpatient Age: 70 Room: [facility] ENDO ROOM 3 Gender: Female Note Status: Finalized Procedure:             Upper GI endoscopy Indications:           Iron deficiency anemia secondary to chronic blood loss Providers:             Jonathon Bellows MD, MD Medicines:             Monitored Anesthesia Care Complications:         No immediate complications. Procedure:             Pre-Anesthesia Assessment:                        - Prior to the procedure, a History and Physical was                         performed, and patient medications, allergies and                         sensitivities were reviewed. The patient's tolerance                         of previous anesthesia was reviewed.                        - The risks and benefits of the procedure and the                         sedation options and risks were discussed with the                         patient. All questions were answered and informed                         consent was obtained.                        - ASA Grade Assessment: III - A patient with severe                         systemic disease.                        After obtaining informed consent, the endoscope was                         passed under direct vision. Throughout the procedure,                         the patient's blood pressure, pulse, and oxygen                         saturations were monitored continuously. The Endoscope                         was introduced through the mouth, and advanced to the  third part of duodenum. The upper GI endoscopy was                         accomplished with ease. The patient tolerated the                         procedure well. Findings:      The esophagus was normal.      A single 40 mm pedunculated polyp with no  bleeding and no stigmata of       recent bleeding was found on the greater curvature of the stomach.      Diffuse atrophic mucosa was found in the entire examined stomach.       Biopsies were taken with a cold forceps for histology.      The examined duodenum was normal.      The cardia and gastric fundus were normal on retroflexion. Impression:            - Normal esophagus.                        - A single gastric polyp.                        - Gastric mucosal atrophy. Biopsied.                        - Normal examined duodenum. Recommendation:        - Await pathology results.                        - Perform an upper endoscopic ultrasound (UEUS) in 6                         weeks.                        - Perform a colonoscopy today. Procedure Code(s):     --- Professional ---                        [phone], Esophagogastroduodenoscopy, flexible,                         transoral; with biopsy, single or multiple Diagnosis Code(s):     --- Professional ---                        K31.7, Polyp of stomach and duodenum                        K31.89, Other diseases of stomach and duodenum                        D50.0, Iron deficiency anemia secondary to blood loss                         (chronic) CPT copyright 2019 American Medical Association. All rights reserved. The codes documented in this report are preliminary and upon coder review may  be revised to meet current compliance requirements. Jonathon Bellows, MD Jonathon Bellows MD, MD 02/18/2021 7:56:20 AM This report has been signed electronically. Number of Addenda: 0  Note Initiated On: 02/18/2021 7:47 AM Estimated Blood Loss:  Estimated blood loss: none.      [facility]

## 2021-02-18 NOTE — Anesthesia Preprocedure Evaluation (Signed)
Anesthesia Evaluation  Patient identified by MRN, date of birth, ID band Patient awake    Reviewed: Allergy & Precautions, NPO status , Patient's Chart, lab work & pertinent test results  Airway Mallampati: III  TM Distance: >3 FB Neck ROM: full    Dental   Pulmonary neg pulmonary ROS,    Pulmonary exam normal        Cardiovascular hypertension, negative cardio ROS Normal cardiovascular exam     Neuro/Psych negative neurological ROS  negative psych ROS   GI/Hepatic negative GI ROS, Neg liver ROS,   Endo/Other  negative endocrine ROS  Renal/GU negative Renal ROS  negative genitourinary   Musculoskeletal   Abdominal (+) + obese,   Peds  Hematology  (+) Blood dyscrasia, anemia ,   Anesthesia Other Findings Past Medical History: No date: Glaucoma No date: Hypertension No date: Osteoarthritis  Past Surgical History: No date: ABDOMINAL HYSTERECTOMY No date: BRAIN SURGERY No date: CHOLECYSTECTOMY  BMI    Body Mass Index: 53.27 kg/m      Reproductive/Obstetrics negative OB ROS                             Anesthesia Physical Anesthesia Plan  ASA: 2  Anesthesia Plan: General   Post-op Pain Management:    Induction:   PONV Risk Score and Plan: Propofol infusion and TIVA  Airway Management Planned:   Additional Equipment:   Intra-op Plan:   Post-operative Plan:   Informed Consent: I have reviewed the patients History and Physical, chart, labs and discussed the procedure including the risks, benefits and alternatives for the proposed anesthesia with the patient or authorized representative who has indicated his/her understanding and acceptance.     Dental Advisory Given  Plan Discussed with: Anesthesiologist, CRNA and Surgeon  Anesthesia Plan Comments:         Anesthesia Quick Evaluation

## 2021-02-18 NOTE — Op Note (Addendum)
[facility] Gastroenterology Patient Name: Ashley Alvarado Procedure Date: 02/18/2021 7:43 AM MRN: [id number] Account #: [id number] Date of Birth: 20-Aug-1950 Admit Type: Inpatient Age: 70 Room: [facility] ENDO ROOM 3 Gender: Female Note Status: Finalized Procedure:             Colonoscopy Indications:           Gastrointestinal bleeding Providers:             Jonathon Bellows MD, MD Medicines:             Monitored Anesthesia Care Complications:         No immediate complications. Procedure:             Pre-Anesthesia Assessment:                        - Prior to the procedure, a History and Physical was                         performed, and patient medications, allergies and                         sensitivities were reviewed. The patient's tolerance                         of previous anesthesia was reviewed.                        - The risks and benefits of the procedure and the                         sedation options and risks were discussed with the                         patient. All questions were answered and informed                         consent was obtained.                        - ASA Grade Assessment: III - A patient with severe                         systemic disease.                        After obtaining informed consent, the colonoscope was                         passed under direct vision. Throughout the procedure,                         the patient's blood pressure, pulse, and oxygen                         saturations were monitored continuously. The                         Colonoscope was introduced through the anus and  advanced to the the cecum, identified by the                         appendiceal orifice. The colonoscopy was performed                         with ease. The patient tolerated the procedure well.                         The quality of the bowel preparation was excellent. Findings:      The perianal and  digital rectal examinations were normal.      A 6 mm polyp was found in the distal rectum. The polyp was sessile. The       polyp was removed with a cold snare. Resection and retrieval were       complete.      Multiple small-mouthed diverticula were found in the sigmoid colon.      The exam was otherwise without abnormality on direct and retroflexion       views.      For unknown reasons although multiple pictures were taken , not captured       by the software Impression:            - One 6 mm polyp in the distal rectum, removed with a                         cold snare. Resected and retrieved.                        - Diverticulosis in the sigmoid colon.                        - The examination was otherwise normal on direct and                         retroflexion views. Recommendation:        - Return patient to hospital ward for ongoing care.                        - Advance diet as tolerated.                        - Continue present medications.                        - Return to my office in 4 weeks. Procedure Code(s):     --- Professional ---                        [phone], Colonoscopy, flexible; with removal of                         tumor(s), polyp(s), or other lesion(s) by snare                         technique Diagnosis Code(s):     --- Professional ---                        K62.1, Rectal polyp  K92.2, Gastrointestinal hemorrhage, unspecified                        K57.30, Diverticulosis of large intestine without                         perforation or abscess without bleeding CPT copyright 2019 American Medical Association. All rights reserved. The codes documented in this report are preliminary and upon coder review may  be revised to meet current compliance requirements. Jonathon Bellows, MD Jonathon Bellows MD, MD 02/18/2021 8:11:54 AM This report has been signed electronically. Number of Addenda: 0 Note Initiated On: 02/18/2021 7:43 AM Scope Withdrawal  Time: 0 hours 8 minutes 15 seconds  Total Procedure Duration: 0 hours 10 minutes 15 seconds  Estimated Blood Loss:  Estimated blood loss: none.      [facility]

## 2021-02-18 NOTE — Transfer of Care (Signed)
Immediate Anesthesia Transfer of Care Note  Patient: Ashley Alvarado  Procedure(s) Performed: COLONOSCOPY ESOPHAGOGASTRODUODENOSCOPY (EGD) WITH PROPOFOL  Patient Location: Endoscopy Unit  Anesthesia Type:General  Level of Consciousness: awake and alert   Airway & Oxygen Therapy: Patient Spontanous Breathing and Patient connected to nasal cannula oxygen  Post-op Assessment: Report given to RN and Post -op Vital signs reviewed and stable  Post vital signs: Reviewed  Last Vitals:  Vitals Value Taken Time  BP 109/73 02/18/21 0818  Temp    Pulse 78 02/18/21 0818  Resp 16 02/18/21 0818  SpO2 100 % 02/18/21 0818    Last Pain:  Vitals:   02/18/21 0720  TempSrc: Temporal  PainSc: 0-No pain         Complications: No notable events documented.

## 2021-02-18 NOTE — Discharge Summary (Signed)
Physician Discharge Summary   Ashley Alvarado  female DOB: 02/27/1951  [id number]  PCP: Frazier Richards, MD  Admit date: 02/15/2021 Discharge date: 02/18/2021  Admitted From: home Disposition:  home CODE STATUS: Full code  Discharge Instructions     Discharge instructions   Complete by: As directed    You have a large polyp in your stomach.  Please follow up with GI Dr. Vicente Males in outpatient clinic in 4 weeks, and he will refer you to [facility] or [facility] for resection.  I have prescribed you 1 months of protonix for acid reflux reduction.  Follow up with Dr. Vicente Males for decision to continue.  You are anemic, and very deficient in vit B12.  You got 1 injection of vit B12 in the hospital, continue to take over-the-counter vit B12 supplements.   Dr. Enzo Bi [facility] Course:  For full details, please see H&P, progress notes, consult notes and ancillary notes.  Briefly,  Ashley Alvarado is a 70 y.o. female with medical history significant for HTN, osteoarthritis on chronic NSAIDs, BMI over 50, chronic constipation, pituitary adenoma with history of radiation who follows with endocrinology, who presents to the ED with a 1 month history of weakness and of palpitations, lightheadedness until day of presentation when she felt dizzy and started seeing black spots and felt like she was about to pass out and decided to come into the ED.    States for the past month she has been seeing bright red blood in her stool intermittently especially when she strained to have a bowel movement.  States she had a normal colonoscopy about 5 years ago.  Takes NSAIDs about 3 times a week for osteoarthritic pain.  Has occasional acid reflux symptoms.   Symptomatic anemia Presyncope and palpitation 2/2 anemia Severe vit B12 def --hemoglobin 6.3 on presentation.  Received 2u pRBC.  Hgb stable and was 7.8 prior to discharge. --anemia workup showed vit B12 <50.  Iron and folate wnl. --IV vit B12 x1  given, and pt was discharged on oral vit B12 supplement.   Hematochezia Chronic NSAID use - Intermittent hematochezia by 1 month and history of NSAID use --started on IV PPI --colonoscopy found sigmoid diverticulosis.  EGD found a large 40 mm polyp in the stomach.   --pt will follow up with GI Dr. Vicente Males in outpatient clinic in 4 weeks, and be referred to [facility] or [facility] for resection. --Pt was discharged on oral PPI for 1 month, to be continued by GI if needed.     Pituitary adenoma with extrasellar extension (HCC) - No acute disease.  Follows with endocrinology     Morbid obesity with BMI of 50.0-59.9, adult ([place]) - Complicating factor to overall prognosis and care     Essential hypertension, not currently active --Home amlodipine, HCTZ, losartan were held during hospitalization, and pt's BP had been normal.  All 3 BP meds held at discharge, and pt was advised to follow up with PCP to see if/when to resume.     Osteoarthritis --avoid NSAIDs   Glaucoma - Continue Xalatan   Discharge Diagnoses:  Active Problems:   Symptomatic anemia   Hematochezia   Constipation   Pituitary adenoma with extrasellar extension (HCC)   Morbid obesity with BMI of 50.0-59.9, adult [facility])   Essential hypertension   Osteoarthritis     Discharge Instructions:  Allergies as of 02/18/2021   No Known Allergies  Medication List     STOP taking these medications    meloxicam 15 MG tablet Commonly known as: MOBIC       TAKE these medications    albuterol 108 (90 Base) MCG/ACT inhaler Commonly known as: VENTOLIN HFA Inhale 2 puffs into the lungs every 6 (six) hours as needed for shortness of breath.   amLODipine 10 MG tablet Commonly known as: NORVASC Hold until followup with your PCP because your blood pressure was normal without any BP medications. What changed:  how much to take how to take this when to take this additional instructions   aspirin EC 81 MG tablet Take 81 mg by  mouth daily.   cetirizine 10 MG tablet Commonly known as: ZYRTEC Take 10 mg by mouth daily as needed for allergies or rhinitis.   hydrochlorothiazide 25 MG tablet Commonly known as: HYDRODIURIL Hold until followup with your PCP because your blood pressure was normal without any BP medications. What changed:  how much to take how to take this when to take this additional instructions   latanoprost 0.005 % ophthalmic solution Commonly known as: XALATAN Place 1 drop into both eyes at bedtime.   losartan 100 MG tablet Commonly known as: COZAAR Hold until followup with your PCP because your blood pressure was normal without any BP medications. What changed:  how much to take how to take this when to take this additional instructions   ondansetron 4 MG tablet Commonly known as: ZOFRAN Take 4 mg by mouth every 8 (eight) hours as needed for nausea.   pantoprazole 40 MG tablet Commonly known as: Protonix Take 1 tablet (40 mg total) by mouth daily.   vitamin B-12 1000 MCG tablet Commonly known as: CYANOCOBALAMIN Take 1 tablet (1,000 mcg total) by mouth daily. Can take any over-the-counter supplement.         Follow-up Information     Frazier Richards, MD Follow up in 1 week(s).   Specialty: Family Medicine Contact information: [place] [address] [phone]         Jonathon Bellows, MD Follow up in 4 week(s).   Specialty: Gastroenterology Contact information: [place] [address] [phone]                 No Known Allergies   The results of significant diagnostics from this hospitalization (including imaging, microbiology, ancillary and laboratory) are listed below for reference.   Consultations:   Procedures/Studies: DG Chest 1 View  Result Date: 02/15/2021 CLINICAL DATA:  Weakness and shortness of breath.  Nausea EXAM: CHEST  1 VIEW COMPARISON:  11/26/2015 FINDINGS: Elevation of the right  hemidiaphragm. Heart size and pulmonary vascularity are normal for technique. Lungs are clear. No pleural effusions. No pneumothorax. Mediastinal contours appear intact. Degenerative changes in the spine and shoulders. IMPRESSION: No active disease. Electronically Signed   By: Lucienne Capers M.D.   On: 02/15/2021 17:15   CT Head Wo Contrast  Result Date: 02/15/2021 CLINICAL DATA:  Head trauma, minor (Age >= 65y) EXAM: CT HEAD WITHOUT CONTRAST TECHNIQUE: Contiguous axial images were obtained from the base of the skull through the vertex without intravenous contrast. COMPARISON:  July 23, 2005 FINDINGS: Brain: No acute infarction or hemorrhage. Postsurgical changes of the RIGHT frontal lobe. 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 It is estimated to measure 15 x 14 mm (series 4, image 28). Vascular: No hyperdense vessel or unexpected calcification. Skull:  Postsurgical changes status post resection of a mass in the RIGHT frontal lobe. No acute fracture. Sinuses/Orbits: Moderate mucosal thickening of bilateral maxillary sinuses and the sphenoid sinus. Other: None. IMPRESSION: 1. 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 Recommend further dedicated evaluation with pituitary protocol MRI with and without contrast if not previously performed. 2. Otherwise no acute intracranial abnormality. Electronically Signed   By: Valentino Saxon MD   On: 02/15/2021 17:56   US Abdomen Limited RUQ (LIVER/GB)  Result Date: 02/16/2021 CLINICAL DATA:  Abnormal liver function tests EXAM: ULTRASOUND ABDOMEN LIMITED RIGHT UPPER QUADRANT COMPARISON:  None. FINDINGS: Gallbladder: History of cholecystectomy Common bile duct: Diameter: 1 cm.  Partial coverage shows no visible filling defect. Liver: No focal lesion identified. Within normal limits in parenchymal echogenicity. Portal vein is patent on color Doppler imaging with normal direction of blood flow  towards the liver. IMPRESSION: 1. Mildly dilated CBD which is usually normal after cholecystectomy. 2. Unremarkable appearance of the liver. Electronically Signed   By: Monte Fantasia M.D.   On: 02/16/2021 08:38      Labs: BNP (last 3 results) No results for input(s): BNP in the last 8760 hours. Basic Metabolic Panel: Recent Labs  Lab 02/15/21 1311 02/17/21 0430 02/18/21 0521  NA 140 139 138  K 4.0 3.6 3.8  CL 106 104 102  CO2 '27 29 28  '$ GLUCOSE 184* 152* 174*  BUN '23 22 19  '$ CREATININE 1.08* 1.03* 1.09*  CALCIUM 9.6 9.1 9.1  MG  --  2.0 2.0   Liver Function Tests: Recent Labs  Lab 02/15/21 1311  AST 109*  ALT 48*  ALKPHOS 49  BILITOT 1.7*  PROT 7.1  ALBUMIN 4.2   No results for input(s): LIPASE, AMYLASE in the last 168 hours. No results for input(s): AMMONIA in the last 168 hours. CBC: Recent Labs  Lab 02/15/21 1311 02/16/21 0041 02/16/21 2041 02/17/21 0430 02/18/21 0521  WBC 5.4  --   --  3.3* 4.2  HGB 6.3* 7.1* 7.6* 7.4* 7.8*  HCT 18.9* 20.6* 22.4* 21.6* 23.1*  MCV 99.5  --   --  93.5 94.3  PLT 107*  --   --  83* 107*   Cardiac Enzymes: No results for input(s): CKTOTAL, CKMB, CKMBINDEX, TROPONINI in the last 168 hours. BNP: Invalid input(s): POCBNP CBG: No results for input(s): GLUCAP in the last 168 hours. D-Dimer No results for input(s): DDIMER in the last 72 hours. Hgb A1c No results for input(s): HGBA1C in the last 72 hours. Lipid Profile No results for input(s): CHOL, HDL, LDLCALC, TRIG, CHOLHDL, LDLDIRECT in the last 72 hours. Thyroid function studies No results for input(s): TSH, T4TOTAL, T3FREE, THYROIDAB in the last 72 hours.  Invalid input(s): FREET3 Anemia work up Recent Labs    02/15/21 1311 02/16/21 0820  VITAMINB12  --  <50*  FOLATE  --  26.0  TIBC 276  --   IRON 144  --   RETICCTPCT 2.4  --    Urinalysis    Component Value Date/Time   COLORURINE AMBER (A) 02/15/2021 1311   APPEARANCEUR CLOUDY (A) 02/15/2021 1311    LABSPEC 1.016 02/15/2021 1311   PHURINE 5.0 02/15/2021 1311   GLUCOSEU NEGATIVE 02/15/2021 1311   HGBUR NEGATIVE 02/15/2021 1311   BILIRUBINUR NEGATIVE 02/15/2021 1311   KETONESUR NEGATIVE 02/15/2021 1311   PROTEINUR NEGATIVE 02/15/2021 1311   NITRITE NEGATIVE 02/15/2021 1311   LEUKOCYTESUR NEGATIVE 02/15/2021 1311   Sepsis Labs Invalid input(s): PROCALCITONIN,  WBC,  [facility] Recent Results (from the past 240 hour(s))  Resp Panel by RT-PCR (Flu A&B, Covid) Nasopharyngeal Swab     Status: None   Collection Time: 02/15/21  5:14 PM   Specimen: Nasopharyngeal Swab; Nasopharyngeal(NP) swabs in vial transport medium  Result Value Ref Range Status   SARS Coronavirus 2 by RT PCR NEGATIVE NEGATIVE Final    Comment: (NOTE) SARS-CoV-2 target nucleic acids are NOT DETECTED.  The SARS-CoV-2 RNA is generally detectable in upper respiratory specimens during the acute phase of infection. The lowest concentration of SARS-CoV-2 viral copies this assay can detect is 138 copies/mL. A negative result does not preclude SARS-Cov-2 infection and should not be used as the sole basis for treatment or other patient management decisions. A negative result may occur with  improper specimen collection/handling, submission of specimen other than nasopharyngeal swab, presence of viral mutation(s) within the areas targeted by this assay, and inadequate number of viral copies(<138 copies/mL). A negative result must be combined with clinical observations, patient history, and epidemiological information. The expected result is Negative.  Fact Sheet for Patients:  EntrepreneurPulse.com.au  Fact Sheet for Healthcare Providers:  IncredibleEmployment.be  This test is no t yet approved or cleared by the [place] FDA and  has been authorized for detection and/or diagnosis of SARS-CoV-2 by FDA under an Emergency Use Authorization (EUA). This EUA will remain   in effect (meaning this test can be used) for the duration of the COVID-19 declaration under Section 564(b)(1) of the Act, 21 U.S.C.section 360bbb-3(b)(1), unless the authorization is terminated  or revoked sooner.       Influenza A by PCR NEGATIVE NEGATIVE Final   Influenza B by PCR NEGATIVE NEGATIVE Final    Comment: (NOTE) The Xpert Xpress SARS-CoV-2/FLU/RSV plus assay is intended as an aid in the diagnosis of influenza from Nasopharyngeal swab specimens and should not be used as a sole basis for treatment. Nasal washings and aspirates are unacceptable for Xpert Xpress SARS-CoV-2/FLU/RSV testing.  Fact Sheet for Patients: EntrepreneurPulse.com.au  Fact Sheet for Healthcare Providers: IncredibleEmployment.be  This test is not yet approved or cleared by the [place] FDA and has been authorized for detection and/or diagnosis of SARS-CoV-2 by FDA under an Emergency Use Authorization (EUA). This EUA will remain in effect (meaning this test can be used) for the duration of the COVID-19 declaration under Section 564(b)(1) of the Act, 21 U.S.C. section 360bbb-3(b)(1), unless the authorization is terminated or revoked.  Performed at [facility], [address] [place], [place] [address]      Total time spend on discharging this patient, including the last patient exam, discussing the hospital stay, instructions for ongoing care as it relates to all pertinent caregivers, as well as preparing the medical discharge records, prescriptions, and/or referrals as applicable, is 40 minutes.    Enzo Bi, MD  [facility] 02/18/2021, 9:41 AM     A single 40 mm pedunculated polyp with no bleeding and no stigmata of recent bleeding was found on the greater curvature of the stomach.   Perform an upper endoscopic ultrasound (UEUS) in 6 weeks.

## 2021-02-18 NOTE — H&P (Signed)
Ashley Bellows, MD [address] [place], [place], [place], [address] [address], [place], [place], [place], [address] Phone: [phone]  Fax: [phone]  Primary Care Physician:  Ashley Richards, MD   Pre-Procedure History & Physical: HPI:  Ashley Alvarado is a 70 y.o. female is here for an endoscopy and colonoscopy    Past Medical History:  Diagnosis Date   Glaucoma    Hypertension    Osteoarthritis     Past Surgical History:  Procedure Laterality Date   ABDOMINAL HYSTERECTOMY     BRAIN SURGERY     CHOLECYSTECTOMY      Prior to Admission medications   Medication Sig Start Date End Date Taking? Authorizing Provider  albuterol (VENTOLIN HFA) 108 (90 Base) MCG/ACT inhaler Inhale 2 puffs into the lungs every 6 (six) hours as needed for shortness of breath. 09/18/12  Yes [provider]  amLODipine (NORVASC) 10 MG tablet Take 10 mg by mouth daily.   Yes [provider]  aspirin EC 81 MG tablet Take 81 mg by mouth daily.   Yes [provider]  hydrochlorothiazide (HYDRODIURIL) 25 MG tablet Take 25 mg by mouth daily. 10/15/20  Yes [provider]  latanoprost (XALATAN) 0.005 % ophthalmic solution Place 1 drop into both eyes at bedtime. 11/13/12  Yes [provider]  losartan (COZAAR) 100 MG tablet Take 100 mg by mouth daily. 11/19/20  Yes [provider]  meloxicam (MOBIC) 15 MG tablet Take 15 mg by mouth daily as needed for pain.   Yes [provider]  cetirizine (ZYRTEC) 10 MG tablet Take 10 mg by mouth daily as needed for allergies or rhinitis. 10/14/20   [provider]  ondansetron (ZOFRAN) 4 MG tablet Take 4 mg by mouth every 8 (eight) hours as needed for nausea. 01/28/21   [provider]    Allergies as of 02/15/2021   (No Known Allergies)    Family History  Problem Relation Age of Onset   Hypertension Father    Diabetes Mother    Hypertension Mother    Kidney disease Mother     Breast cancer Neg Hx     Social History   Socioeconomic History   Marital status: Divorced    Spouse name: Not on file   Number of children: Not on file   Years of education: Not on file   Highest education level: Not on file  Occupational History   Not on file  Tobacco Use   Smoking status: Never   Smokeless tobacco: Not on file  Substance and Sexual Activity   Alcohol use: No    Alcohol/week: 0.0 standard drinks   Drug use: No   Sexual activity: Not on file  Other Topics Concern   Not on file  Social History Narrative   Not on file   Social Determinants of Health   Financial Resource Strain: Not on file  Food Insecurity: Not on file  Transportation Needs: Not on file  Physical Activity: Not on file  Stress: Not on file  Social Connections: Not on file  Intimate Partner Violence: Not on file    Review of Systems: See HPI, otherwise negative ROS  Physical Exam: BP 139/81   Pulse 84   Temp (!) 97 F (36.1 C) (Temporal)   Resp 16   Ht '5\' 5"'$  (1.651 m)   Wt (!) 145.2 kg   SpO2 99%   BMI 53.27 kg/m  General:   Alert,  pleasant and cooperative in NAD Head:  Normocephalic and atraumatic. Neck:  Supple; no masses or thyromegaly. Lungs:  Clear throughout to auscultation, normal respiratory effort.    Heart:  +S1, +S2, Regular rate and rhythm, No edema. Abdomen:  Soft, nontender and nondistended. Normal bowel sounds, without guarding, and without rebound.   Neurologic:  Alert and  oriented x4;  grossly normal neurologically.  Impression/Plan: Ashley Alvarado is here for an endoscopy and colonoscopy  to be performed for  evaluation of anemia    Risks, benefits, limitations, and alternatives regarding endoscopy have been reviewed with the patient.  Questions have been answered.  All parties agreeable.   Ashley Bellows, MD  02/18/2021, 7:39 AM

## 2021-02-18 NOTE — Anesthesia Postprocedure Evaluation (Signed)
Anesthesia Post Note  Patient: Ashley Alvarado  Procedure(s) Performed: COLONOSCOPY ESOPHAGOGASTRODUODENOSCOPY (EGD) WITH PROPOFOL  Patient location during evaluation: Endoscopy Anesthesia Type: General Level of consciousness: awake and alert Pain management: pain level controlled Vital Signs Assessment: post-procedure vital signs reviewed and stable Respiratory status: spontaneous breathing, nonlabored ventilation, respiratory function stable and patient connected to nasal cannula oxygen Cardiovascular status: blood pressure returned to baseline and stable Postop Assessment: no apparent nausea or vomiting Anesthetic complications: no   No notable events documented.   Last Vitals:  Vitals:   02/18/21 0815 02/18/21 0818  BP: 107/61 109/73  Pulse: 78 78  Resp: 16 16  Temp: (!) 36.2 C (!) 36.2 C  SpO2: 99% 100%    Last Pain:  Vitals:   02/18/21 0845  TempSrc:   PainSc: 0-No pain                 Margaree Mackintosh

## 2021-02-18 NOTE — Plan of Care (Signed)
  Problem: Bowel/Gastric: Goal: Will show no signs and symptoms of gastrointestinal bleeding Outcome: Progressing   Problem: Fluid Volume: Goal: Will show no signs and symptoms of excessive bleeding Outcome: Progressing   Problem: Clinical Measurements: Goal: Complications related to the disease process, condition or treatment will be avoided or minimized Outcome: Progressing   

## 2021-02-19 ENCOUNTER — Encounter: Payer: Self-pay | Admitting: Gastroenterology

## 2021-02-19 LAB — SURGICAL PATHOLOGY

## 2021-03-03 ENCOUNTER — Telehealth: Payer: Self-pay

## 2021-03-03 DIAGNOSIS — K317 Polyp of stomach and duodenum: Secondary | ICD-10-CM

## 2021-03-03 NOTE — Telephone Encounter (Signed)
Ashley Bellows, MD  03/02/2021  9:46 AM EDT Back to Top     Inform biopsies of the stomach and rectal polyp were benign, there was a largepolyp ion the stomach - I recommended EUS- can we have that arranged please   Patient was contacted and she agreed on doing the EUS. I told her that she would be receiving a call from [name] once her appointment was scheduled. Patient then had no further questions.

## 2021-03-04 NOTE — Addendum Note (Signed)
Addended by: Wayna Chalet on: 03/04/2021 01:39 PM   Modules accepted: Orders

## 2021-03-25 ENCOUNTER — Other Ambulatory Visit: Payer: Self-pay

## 2021-03-26 ENCOUNTER — Other Ambulatory Visit: Payer: Self-pay

## 2021-03-26 ENCOUNTER — Encounter: Payer: Self-pay | Admitting: Gastroenterology

## 2021-03-26 ENCOUNTER — Ambulatory Visit (INDEPENDENT_AMBULATORY_CARE_PROVIDER_SITE_OTHER): Payer: Medicare HMO | Admitting: Gastroenterology

## 2021-03-26 VITALS — BP 170/92 | HR 98 | Temp 98.8°F | Ht 65.0 in | Wt 292.0 lb

## 2021-03-26 DIAGNOSIS — R945 Abnormal results of liver function studies: Secondary | ICD-10-CM

## 2021-03-26 DIAGNOSIS — K317 Polyp of stomach and duodenum: Secondary | ICD-10-CM | POA: Diagnosis not present

## 2021-03-26 DIAGNOSIS — D649 Anemia, unspecified: Secondary | ICD-10-CM | POA: Diagnosis not present

## 2021-03-26 DIAGNOSIS — R7989 Other specified abnormal findings of blood chemistry: Secondary | ICD-10-CM

## 2021-03-26 NOTE — Progress Notes (Signed)
Ashley Bellows MD, MRCP(U.K) [address]  [address]  [place] [address]  Main: [phone]  Fax: [phone]   Primary Care Physician: Ashley Richards, MD  Primary Gastroenterologist:  Dr. Jonathon Alvarado   Chief complaint Hospital follow-up   HPI: Ashley Alvarado is a 70 y.o. female   Summary of history :  She was seen by me when admitted on 02/15/2021 for anemia.  Chronic NSAID use.  She presented with weakness palpitations and bright red blood per rectum with associated constipation for over a month.  Hemoglobin was 6.3 g.  No evidence of iron deficiency.  LFTs were noted to be abnormal.  I performed an EGD and colonoscopy on 02/18/2021 and a 40 mm pedunculated submucosal lesion was found on the greater curvature of the stomach.  Diffuse atrophic mucosa seen in the remaining part of the stomach.  Colonoscopy showed diverticulosis of the colon.  A 6 mm polyp in the distal rectum was resected.  Biopsies of the stomach showed no abnormalities.  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  Hemoglobin at discharge was 7.8 g, B12 was less than 50 folate was normal.  AST 109 ALT of 48 total bilirubin 1.7 predominantly indirect.  Right upper quadrant ultrasound showed unremarkable appearance of the liver  Interval history   02/17/2021-03/26/2021  Is doing well taking our vitamin B12 supplementation.  Denies any dizziness.  Current Outpatient Medications  Medication Sig Dispense Refill   albuterol (VENTOLIN HFA) 108 (90 Base) MCG/ACT inhaler Inhale 2 puffs into the lungs every 6 (six) hours as needed for shortness of breath.     amLODipine (NORVASC) 10 MG tablet Hold until followup with your PCP because your blood pressure was normal without any BP medications.     aspirin EC 81 MG tablet Take 81 mg by mouth daily.     hydrochlorothiazide (HYDRODIURIL) 25 MG tablet Hold until followup with your PCP because your blood pressure was normal without any BP medications.     latanoprost (XALATAN) 0.005  % ophthalmic solution Place 1 drop into both eyes at bedtime.     losartan (COZAAR) 100 MG tablet Hold until followup with your PCP because your blood pressure was normal without any BP medications.     Omega-3 1000 MG CAPS Take by mouth.     pantoprazole (PROTONIX) 40 MG tablet Take 1 tablet (40 mg total) by mouth daily. 30 tablet 0   vitamin B-12 (CYANOCOBALAMIN) 1000 MCG tablet Take 1 tablet (1,000 mcg total) by mouth daily. Can take any over-the-counter supplement.     cetirizine (ZYRTEC) 10 MG tablet Take 10 mg by mouth daily as needed for allergies or rhinitis. (Patient not taking: Reported on 03/26/2021)     ondansetron (ZOFRAN) 4 MG tablet Take 4 mg by mouth every 8 (eight) hours as needed for nausea. (Patient not taking: Reported on 03/26/2021)     No current facility-administered medications for this visit.    Allergies as of 03/26/2021   (No Known Allergies)    ROS:  General: Negative for anorexia, weight loss, fever, chills, fatigue, weakness. ENT: Negative for hoarseness, difficulty swallowing , nasal congestion. CV: Negative for chest pain, angina, palpitations, dyspnea on exertion, peripheral edema.  Respiratory: Negative for dyspnea at rest, dyspnea on exertion, cough, sputum, wheezing.  GI: See history of present illness. GU:  Negative for dysuria, hematuria, urinary incontinence, urinary frequency, nocturnal urination.  Endo: Negative for unusual weight change.    Physical Examination:   BP Marland Kitchen)  170/92   Pulse 98   Temp 98.8 F (37.1 C) (Oral)   Ht '5\' 5"'$  (1.651 m)   Wt 292 lb (132.5 kg)   BMI 48.59 kg/m   General: Well-nourished, well-developed in no acute distress.  Eyes: No icterus. Conjunctivae pink. Mouth: Oropharyngeal mucosa moist and pink , no lesions erythema or exudate. Extremities: No lower extremity edema. No clubbing or deformities. Neuro: Alert and oriented x 3.  Grossly intact. Skin: Warm and dry, no jaundice.   Psych: Alert and cooperative, normal  mood and affect.   Imaging Studies: No results found.  Assessment and Plan:   Ashley Alvarado is a 70 y.o. y/o female is here today for hospital follow-up.  Was evaluated in the hospital for anemia and rectal bleeding.  EGD showed a submucosal lesion in the stomach needs further evaluation.  Biopsies of stomach showed no evidence of atrophic gastritis.  B12 level was undetectable.  Iron studies are normal.  Incidental finding of elevated transaminases.  Right upper quadrant ultrasound was normal.  Likely fatty liver disease  Plan 1.  Check CBC to ensure hemoglobin has not dropped, B12-continue replacement 2.  Submucosal lesion in the stomach referred for EUS 3.  Abnormal LFTs needs full autoimmune and viral hepatitis work-up   Dr Ashley Bellows  MD,MRCP [facility] Follow up in 8-10 weeks

## 2021-03-26 NOTE — Addendum Note (Signed)
Addended by: Wayna Chalet on: 03/26/2021 01:43 PM   Modules accepted: Orders

## 2021-04-01 LAB — CELIAC DISEASE AB SCREEN W/RFX
Antigliadin Abs, IgA: 5 units (ref 0–19)
Transglutaminase IgA: <2 U/mL (ref 0–3)

## 2021-04-01 LAB — HEPATIC FUNCTION PANEL
ALT: 16 IU/L (ref 0–32)
AST: 32 IU/L (ref 0–40)
Albumin: 4.1 g/dL (ref 3.7–4.7)
Alkaline Phosphatase: 82 IU/L (ref 44–121)
Bilirubin Total: 0.4 mg/dL (ref 0.0–1.2)
Bilirubin, Direct: 0.13 mg/dL (ref 0.00–0.40)
Total Protein: 7 g/dL (ref 6.0–8.5)

## 2021-04-01 LAB — HEPATITIS B E ANTIBODY: Hep B E Ab: NEGATIVE

## 2021-04-01 LAB — IRON,TIBC AND FERRITIN PANEL
Ferritin: 310 ng/mL — ABNORMAL HIGH (ref 15–150)
Iron Saturation: 13 % — ABNORMAL LOW (ref 15–55)
Iron: 29 ug/dL (ref 27–139)
Total Iron Binding Capacity: 222 ug/dL — ABNORMAL LOW (ref 250–450)
UIBC: 193 ug/dL (ref 118–369)

## 2021-04-01 LAB — HEPATITIS B SURFACE ANTIGEN: Hepatitis B Surface Ag: NEGATIVE

## 2021-04-01 LAB — HEPATITIS B SURFACE ANTIBODY,QUALITATIVE: Hep B Surface Ab, Qual: REACTIVE

## 2021-04-01 LAB — CBC WITH DIFFERENTIAL/PLATELET

## 2021-04-01 LAB — ALPHA-1-ANTITRYPSIN: A-1 Antitrypsin: 135 mg/dL (ref 101–187)

## 2021-04-01 LAB — CERULOPLASMIN: Ceruloplasmin: 31.6 mg/dL (ref 19.0–39.0)

## 2021-04-01 LAB — ANA: Anti Nuclear Antibody (ANA): POSITIVE — AB

## 2021-04-01 LAB — IMMUNOGLOBULINS A/E/G/M, SERUM
IgA/Immunoglobulin A, Serum: 287 mg/dL (ref 64–422)
IgE (Immunoglobulin E), Serum: 7 IU/mL (ref 6–495)
IgG (Immunoglobin G), Serum: 1735 mg/dL — ABNORMAL HIGH (ref 586–1602)
IgM (Immunoglobulin M), Srm: 78 mg/dL (ref 26–217)

## 2021-04-01 LAB — HEPATITIS A ANTIBODY, TOTAL: hep A Total Ab: POSITIVE — AB

## 2021-04-01 LAB — MITOCHONDRIAL/SMOOTH MUSCLE AB PNL
Mitochondrial Ab: <20 Units (ref 0.0–20.0)
Smooth Muscle Ab: 13 Units (ref 0–19)

## 2021-04-01 LAB — CK: Total CK: 34 U/L (ref 32–182)

## 2021-04-01 LAB — HEPATITIS B CORE ANTIBODY, TOTAL: Hep B Core Total Ab: NEGATIVE

## 2021-04-01 LAB — B12 AND FOLATE PANEL
Folate: 9.9 ng/mL (ref >3.0)
Vitamin B-12: 615 pg/mL (ref 232–1245)

## 2021-04-01 LAB — ANTI-MICROSOMAL ANTIBODY LIVER / KIDNEY: LKM1 Ab: 1.7 Units (ref 0.0–20.0)

## 2021-04-01 LAB — HEPATITIS B E ANTIGEN: Hep B E Ag: NEGATIVE

## 2021-04-01 LAB — HEPATITIS C ANTIBODY: Hep C Virus Ab: 0.1 s/co ratio (ref 0.0–0.9)

## 2021-04-01 LAB — HIV ANTIBODY (ROUTINE TESTING W REFLEX): HIV Screen 4th Generation wRfx: NONREACTIVE

## 2021-04-01 LAB — GAMMA GT: GGT: 15 IU/L (ref 0–60)

## 2021-04-15 NOTE — Progress Notes (Signed)
Inform patient that liver work-up was negative.  Patient is anemic with normal iron studies but a low platelet count I would recommend referral to Dr. Tasia Catchings in hematology to evaluate further please

## 2021-04-20 ENCOUNTER — Telehealth: Payer: Self-pay

## 2021-04-20 DIAGNOSIS — D696 Thrombocytopenia, unspecified: Secondary | ICD-10-CM

## 2021-04-20 NOTE — Telephone Encounter (Signed)
Called patient to let her know that below information. Patient agreed and had no further questions.

## 2021-04-20 NOTE — Telephone Encounter (Signed)
-----   Message from Jonathon Bellows, MD sent at 04/15/2021 11:26 AM EDT ----- Inform patient that liver work-up was negative.  Patient is anemic with normal iron studies but a low platelet count I would recommend referral to Dr. Tasia Catchings in hematology to evaluate further please

## 2021-04-23 ENCOUNTER — Encounter: Payer: Self-pay | Admitting: *Deleted

## 2021-04-28 ENCOUNTER — Encounter: Payer: Self-pay | Admitting: Internal Medicine

## 2021-04-28 ENCOUNTER — Inpatient Hospital Stay: Payer: Medicare HMO | Attending: Internal Medicine | Admitting: Internal Medicine

## 2021-04-28 ENCOUNTER — Inpatient Hospital Stay: Payer: Medicare HMO

## 2021-04-28 ENCOUNTER — Other Ambulatory Visit: Payer: Self-pay

## 2021-04-28 DIAGNOSIS — E538 Deficiency of other specified B group vitamins: Secondary | ICD-10-CM | POA: Insufficient documentation

## 2021-04-28 DIAGNOSIS — D649 Anemia, unspecified: Secondary | ICD-10-CM | POA: Diagnosis present

## 2021-04-28 DIAGNOSIS — D696 Thrombocytopenia, unspecified: Secondary | ICD-10-CM | POA: Diagnosis present

## 2021-04-28 DIAGNOSIS — M25562 Pain in left knee: Secondary | ICD-10-CM | POA: Diagnosis not present

## 2021-04-28 DIAGNOSIS — Z79899 Other long term (current) drug therapy: Secondary | ICD-10-CM | POA: Insufficient documentation

## 2021-04-28 DIAGNOSIS — R5383 Other fatigue: Secondary | ICD-10-CM | POA: Insufficient documentation

## 2021-04-28 DIAGNOSIS — M25561 Pain in right knee: Secondary | ICD-10-CM | POA: Insufficient documentation

## 2021-04-28 DIAGNOSIS — Z833 Family history of diabetes mellitus: Secondary | ICD-10-CM | POA: Insufficient documentation

## 2021-04-28 DIAGNOSIS — Z841 Family history of disorders of kidney and ureter: Secondary | ICD-10-CM | POA: Insufficient documentation

## 2021-04-28 DIAGNOSIS — Z8249 Family history of ischemic heart disease and other diseases of the circulatory system: Secondary | ICD-10-CM | POA: Insufficient documentation

## 2021-04-28 DIAGNOSIS — M1990 Unspecified osteoarthritis, unspecified site: Secondary | ICD-10-CM | POA: Insufficient documentation

## 2021-04-28 LAB — VITAMIN B12: Vitamin B-12: 395 pg/mL (ref 180–914)

## 2021-04-28 LAB — LACTATE DEHYDROGENASE: LDH: 101 U/L (ref 98–192)

## 2021-04-28 LAB — RETICULOCYTES
Immature Retic Fract: 9.1 % (ref 2.3–15.9)
RBC.: 4.83 MIL/uL (ref 3.87–5.11)
Retic Count, Absolute: 41.1 10*3/uL (ref 19.0–186.0)
Retic Ct Pct: 0.9 % (ref 0.4–3.1)

## 2021-04-28 LAB — CBC WITH DIFFERENTIAL/PLATELET
Abs Immature Granulocytes: 0.05 10*3/uL (ref 0.00–0.07)
Basophils Absolute: 0.1 10*3/uL (ref 0.0–0.1)
Basophils Relative: 1 %
Eosinophils Absolute: 0.2 10*3/uL (ref 0.0–0.5)
Eosinophils Relative: 3 %
HCT: 39.3 % (ref 36.0–46.0)
Hemoglobin: 11.8 g/dL — ABNORMAL LOW (ref 12.0–15.0)
Immature Granulocytes: 1 %
Lymphocytes Relative: 22 %
Lymphs Abs: 1.4 10*3/uL (ref 0.7–4.0)
MCH: 24.1 pg — ABNORMAL LOW (ref 26.0–34.0)
MCHC: 30 g/dL (ref 30.0–36.0)
MCV: 80.4 fL (ref 80.0–100.0)
Monocytes Absolute: 0.4 10*3/uL (ref 0.1–1.0)
Monocytes Relative: 6 %
Neutro Abs: 4.3 10*3/uL (ref 1.7–7.7)
Neutrophils Relative %: 67 %
Platelets: 220 10*3/uL (ref 150–400)
RBC: 4.89 MIL/uL (ref 3.87–5.11)
RDW: 16.4 % — ABNORMAL HIGH (ref 11.5–15.5)
WBC: 6.4 10*3/uL (ref 4.0–10.5)
nRBC: 0 % (ref 0.0–0.2)

## 2021-04-28 LAB — TECHNOLOGIST SMEAR REVIEW
Plt Morphology: NORMAL
RBC MORPHOLOGY: NORMAL
WBC MORPHOLOGY: NORMAL

## 2021-04-28 NOTE — Assessment & Plan Note (Addendum)
#  Anemia/thrombocytopenia-severe B12 deficiency-July 2022 B12 less than 50.  GI work-up negative for any bleeding. I had a long discussion regarding the multiple causes of thrombocytopenia including but not limited to medications liver disease/splenomegaly infections; immune related thrombocytopenia; less likely malignant causes.  Ultrasound of the liver possible fatty liver no cirrhosis noted.  For now I would recommend- Recommend checking CBC;CMP WITH LDH; monoclonal workup. Check peripheral smear. Also, discussed with the patient that if no clear etiology found- bone marrow biopsy would be suggested. Currently await for the above workup.  Check antiparietal/antiintrinsic antibodies; methylmalonic acid.  #Severe B12 deficiency [July, 2022]-B12 < 50.  Continue B12 on a monthly basis.  #Morbid obesity-Walker/ bilateral Knee pain- off meloxicam [sec to gastritis; per pt]- on tylenol-  Thank you Dr.Anna for allowing me to participate in the care of your pleasant patient. Please do not hesitate to contact me with questions or concerns in the interim.  # DISPOSITION: # labs today- ordered # B12 injection  this  week # follow up in 1 month- MD; labs- cbc/B12 injection-dr.B

## 2021-04-28 NOTE — Progress Notes (Signed)
[facility] CONSULT NOTE  Patient Care Team: Abram Sander, MD as PCP - General (Family Medicine)  CHIEF COMPLAINTS/PURPOSE OF CONSULTATION: Thrombocytopenia/ANEMIA   HEMATOLOGY HISTORY  # July 2022- ANEMIA-THROMBOCYTOPENIA [platelets- 70-80s; WBC- ; Hb-7-8;   Vitamin B-12 232 - 1,245 pg/mL 615  <50 Low  R, CM     # Pituitary adenoma- s/p surgery- s/p RT- 5 weeks [[facility]; last RT in Feb 2022]   July 2022- No focal lesion identified. Within normal limits in parenchymal  echogenicity. Portal vein is patent on color Doppler imaging with normal direction of blood flow towards the liver; IMPRESSION:  Mildly dilated CBD which is usually normal after cholecystectomy. 2. Unremarkable appearance of the liver.   HISTORY OF PRESENTING ILLNESS:  Ashley Alvarado 70 y.o.  female pleasant patient was been referred to [place] for further evaluation of thrombocytopenia/anemia.  Patient was recently admitted to hospital for severe anemia-hemoglobin around 6.3.  Patient is s/p units of PRBC transfusion.  Patient noted to have B12 deficiency less than 50.  Patient received IM injections.  She is currently on p.o. B12.  Patient was recently followed by GI-recommended to [place] for further evaluation regarding parenteral B12 injections.    Review of Systems  Constitutional:  Positive for malaise/fatigue. Negative for chills, diaphoresis, fever and weight loss.  HENT:  Negative for nosebleeds and sore throat.   Eyes:  Negative for double vision.  Respiratory:  Negative for cough, hemoptysis, sputum production, shortness of breath and wheezing.   Cardiovascular:  Negative for chest pain, palpitations, orthopnea and leg swelling.  Gastrointestinal:  Negative for abdominal pain, blood in stool, constipation, diarrhea, heartburn, melena, nausea and vomiting.  Genitourinary:  Negative for dysuria, frequency and urgency.  Musculoskeletal:  Positive for back pain and joint pain.  Skin: Negative.   Negative for itching and rash.  Neurological:  Negative for dizziness, tingling, focal weakness, weakness and headaches.  Endo/Heme/Allergies:  Does not bruise/bleed easily.  Psychiatric/Behavioral:  Negative for depression. The patient is not nervous/anxious and does not have insomnia.     MEDICAL HISTORY:  Past Medical History:  Diagnosis Date   Anemia    Glaucoma    Hypertension    Osteoarthritis     SURGICAL HISTORY: Past Surgical History:  Procedure Laterality Date   ABDOMINAL HYSTERECTOMY     BRAIN SURGERY     CHOLECYSTECTOMY     COLONOSCOPY N/A 02/18/2021   Procedure: COLONOSCOPY;  Surgeon: Wyline Mood, MD;  Location: [facility] ENDOSCOPY;  Service: Gastroenterology;  Laterality: N/A;   ESOPHAGOGASTRODUODENOSCOPY (EGD) WITH PROPOFOL N/A 02/18/2021   Procedure: ESOPHAGOGASTRODUODENOSCOPY (EGD) WITH PROPOFOL;  Surgeon: Wyline Mood, MD;  Location: [facility] ENDOSCOPY;  Service: Gastroenterology;  Laterality: N/A;    SOCIAL HISTORY: Social History   Socioeconomic History   Marital status: Divorced    Spouse name: Not on file   Number of children: Not on file   Years of education: Not on file   Highest education level: Not on file  Occupational History   Not on file  Tobacco Use   Smoking status: Never   Smokeless tobacco: Never  Substance and Sexual Activity   Alcohol use: No    Alcohol/week: 0.0 standard drinks   Drug use: No   Sexual activity: Not on file  Other Topics Concern   Not on file  Social History Narrative   Lives in [place] with son [place] with mom]. Never smoked; no alcohol. Worked in [employer]. Uses walker because of knee pain.  Social Determinants of Health   Financial Resource Strain: Not on file  Food Insecurity: Not on file  Transportation Needs: Not on file  Physical Activity: Not on file  Stress: Not on file  Social Connections: Not on file  Intimate Partner Violence: Not on file    FAMILY HISTORY: Family History  Problem Relation Age  of Onset   Hypertension Father    Diabetes Mother    Hypertension Mother    Kidney disease Mother    Breast cancer Neg Hx     ALLERGIES:  has No Known Allergies.  MEDICATIONS:  Current Outpatient Medications  Medication Sig Dispense Refill   albuterol (VENTOLIN HFA) 108 (90 Base) MCG/ACT inhaler Inhale 2 puffs into the lungs every 6 (six) hours as needed for shortness of breath.     amLODipine (NORVASC) 10 MG tablet Hold until followup with your PCP because your blood pressure was normal without any BP medications.     aspirin EC 81 MG tablet Take 81 mg by mouth daily.     hydrochlorothiazide (HYDRODIURIL) 25 MG tablet Hold until followup with your PCP because your blood pressure was normal without any BP medications.     latanoprost (XALATAN) 0.005 % ophthalmic solution Place 1 drop into both eyes at bedtime.     losartan (COZAAR) 100 MG tablet Hold until followup with your PCP because your blood pressure was normal without any BP medications.     Omega-3 1000 MG CAPS Take by mouth.     vitamin B-12 (CYANOCOBALAMIN) 1000 MCG tablet Take 1 tablet (1,000 mcg total) by mouth daily. Can take any over-the-counter supplement.     cetirizine (ZYRTEC) 10 MG tablet Take 10 mg by mouth daily as needed for allergies or rhinitis. (Patient not taking: No sig reported)     ondansetron (ZOFRAN) 4 MG tablet Take 4 mg by mouth every 8 (eight) hours as needed for nausea. (Patient not taking: No sig reported)     pantoprazole (PROTONIX) 40 MG tablet Take 1 tablet (40 mg total) by mouth daily. 30 tablet 0   No current facility-administered medications for this visit.     Marland Kitchen  PHYSICAL EXAMINATION:   Vitals:   04/28/21 1122  BP: 137/81  Pulse: 89  Resp: 18  Temp: 98.1 F (36.7 C)  SpO2: 100%   Filed Weights   04/28/21 1118  Weight: (!) 301 lb (136.5 kg)    Physical Exam Vitals and nursing note reviewed.  HENT:     Head: Normocephalic and atraumatic.     Mouth/Throat:     Pharynx:  Oropharynx is clear.  Eyes:     Extraocular Movements: Extraocular movements intact.     Pupils: Pupils are equal, round, and reactive to light.  Cardiovascular:     Rate and Rhythm: Normal rate and regular rhythm.  Pulmonary:     Comments: Decreased breath sounds bilaterally.  Abdominal:     Palpations: Abdomen is soft.  Musculoskeletal:        General: Normal range of motion.     Cervical back: Normal range of motion.  Skin:    General: Skin is warm.  Neurological:     General: No focal deficit present.     Mental Status: She is alert and oriented to person, place, and time.  Psychiatric:        Behavior: Behavior normal.        Judgment: Judgment normal.     LABORATORY DATA:  I have reviewed the data as  listed Lab Results  Component Value Date   WBC 6.4 04/28/2021   HGB 11.8 (L) 04/28/2021   HCT 39.3 04/28/2021   MCV 80.4 04/28/2021   PLT 220 04/28/2021   Recent Labs    02/15/21 1311 02/17/21 0430 02/18/21 0521 03/26/21 1346  NA 140 139 138  --   K 4.0 3.6 3.8  --   CL 106 104 102  --   CO2 $Re'27 29 28  'kBp$ --   GLUCOSE 184* 152* 174*  --   BUN $Re'23 22 19  'ZII$ --   CREATININE 1.08* 1.03* 1.09*  --   CALCIUM 9.6 9.1 9.1  --   GFRNONAA 55* 58* 54*  --   PROT 7.1  --   --  7.0  ALBUMIN 4.2  --   --  4.1  AST 109*  --   --  32  ALT 48*  --   --  16  ALKPHOS 49  --   --  82  BILITOT 1.7*  --   --  0.4  BILIDIR 0.4*  --   --  0.13  IBILI 1.3*  --   --   --      No results found.  ASSESSMENT & PLAN:   Thrombocytopenia ([place]) #Anemia/thrombocytopenia-severe B12 deficiency-July 2022 B12 less than 50.  GI work-up negative for any bleeding. I had a long discussion regarding the multiple causes of thrombocytopenia including but not limited to medications liver disease/splenomegaly infections; immune related thrombocytopenia; less likely malignant causes.  Ultrasound of the liver possible fatty liver no cirrhosis noted.  For now I would recommend- Recommend checking CBC;CMP  WITH LDH; monoclonal workup. Check peripheral smear. Also, discussed with the patient that if no clear etiology found- bone marrow biopsy would be suggested. Currently await for the above workup.  Check antiparietal/antiintrinsic antibodies; methylmalonic acid.  #Severe B12 deficiency [July, 2022]-B12 < 50.  Continue B12 on a monthly basis.  #Morbid obesity-Walker/ bilateral Knee pain- off meloxicam [sec to gastritis; per pt]- on tylenol-  Thank you Dr.Anna for allowing me to participate in the care of your pleasant patient. Please do not hesitate to contact me with questions or concerns in the interim.  # DISPOSITION: # labs today- ordered # B12 injection  this  week # follow up in 1 month- MD; labs- cbc/B12 injection-dr.B   All questions were answered. The patient knows to call the clinic with any problems, questions or concerns.   Cammie Sickle, MD 04/28/2021 2:06 PM

## 2021-04-29 LAB — KAPPA/LAMBDA LIGHT CHAINS
Kappa free light chain: 55.7 mg/L — ABNORMAL HIGH (ref 3.3–19.4)
Kappa, lambda light chain ratio: 0.94 (ref 0.26–1.65)
Lambda free light chains: 59.1 mg/L — ABNORMAL HIGH (ref 5.7–26.3)

## 2021-04-29 LAB — METHYLMALONIC ACID, SERUM: Methylmalonic Acid, Quantitative: 142 nmol/L (ref 0–378)

## 2021-04-29 LAB — ANTI-PARIETAL ANTIBODY: Parietal Cell Antibody-IgG: 26.6 Units — ABNORMAL HIGH (ref 0.0–20.0)

## 2021-04-29 LAB — INTRINSIC FACTOR ANTIBODIES: Intrinsic Factor: 1 AU/mL (ref 0.0–1.1)

## 2021-04-30 LAB — IMMUNOFIXATION ELECTROPHORESIS
IgA: 306 mg/dL (ref 64–422)
IgG (Immunoglobin G), Serum: 1757 mg/dL — ABNORMAL HIGH (ref 586–1602)
IgM (Immunoglobulin M), Srm: 79 mg/dL (ref 26–217)
Total Protein ELP: 7.2 g/dL (ref 6.0–8.5)

## 2021-04-30 LAB — HAPTOGLOBIN: Haptoglobin: 102 mg/dL (ref 42–346)

## 2021-05-01 ENCOUNTER — Inpatient Hospital Stay: Payer: Medicare HMO

## 2021-05-01 ENCOUNTER — Other Ambulatory Visit: Payer: Self-pay

## 2021-05-01 DIAGNOSIS — D696 Thrombocytopenia, unspecified: Secondary | ICD-10-CM | POA: Diagnosis not present

## 2021-05-01 DIAGNOSIS — E538 Deficiency of other specified B group vitamins: Secondary | ICD-10-CM

## 2021-05-01 MED ORDER — CYANOCOBALAMIN 1000 MCG/ML IJ SOLN
1000.0000 ug | Freq: Once | INTRAMUSCULAR | Status: AC
  Administered 2021-05-01: 1000 ug via INTRAMUSCULAR
  Filled 2021-05-01: qty 1

## 2021-05-26 ENCOUNTER — Inpatient Hospital Stay: Payer: Medicare HMO

## 2021-05-26 ENCOUNTER — Encounter (INDEPENDENT_AMBULATORY_CARE_PROVIDER_SITE_OTHER): Payer: Self-pay

## 2021-05-26 ENCOUNTER — Encounter: Payer: Self-pay | Admitting: Internal Medicine

## 2021-05-26 ENCOUNTER — Inpatient Hospital Stay (HOSPITAL_BASED_OUTPATIENT_CLINIC_OR_DEPARTMENT_OTHER): Payer: Medicare HMO | Admitting: Internal Medicine

## 2021-05-26 ENCOUNTER — Other Ambulatory Visit: Payer: Self-pay

## 2021-05-26 ENCOUNTER — Inpatient Hospital Stay: Payer: Medicare HMO | Attending: Internal Medicine

## 2021-05-26 VITALS — BP 123/84 | HR 83 | Temp 97.7°F | Resp 20 | Ht 65.0 in | Wt 298.0 lb

## 2021-05-26 DIAGNOSIS — E538 Deficiency of other specified B group vitamins: Secondary | ICD-10-CM

## 2021-05-26 DIAGNOSIS — Z841 Family history of disorders of kidney and ureter: Secondary | ICD-10-CM | POA: Insufficient documentation

## 2021-05-26 DIAGNOSIS — M25561 Pain in right knee: Secondary | ICD-10-CM | POA: Diagnosis not present

## 2021-05-26 DIAGNOSIS — D649 Anemia, unspecified: Secondary | ICD-10-CM | POA: Insufficient documentation

## 2021-05-26 DIAGNOSIS — Z79899 Other long term (current) drug therapy: Secondary | ICD-10-CM | POA: Diagnosis not present

## 2021-05-26 DIAGNOSIS — Z833 Family history of diabetes mellitus: Secondary | ICD-10-CM | POA: Insufficient documentation

## 2021-05-26 DIAGNOSIS — M25562 Pain in left knee: Secondary | ICD-10-CM | POA: Insufficient documentation

## 2021-05-26 DIAGNOSIS — M1990 Unspecified osteoarthritis, unspecified site: Secondary | ICD-10-CM | POA: Diagnosis not present

## 2021-05-26 DIAGNOSIS — R5383 Other fatigue: Secondary | ICD-10-CM | POA: Diagnosis not present

## 2021-05-26 DIAGNOSIS — D696 Thrombocytopenia, unspecified: Secondary | ICD-10-CM

## 2021-05-26 DIAGNOSIS — Z9049 Acquired absence of other specified parts of digestive tract: Secondary | ICD-10-CM | POA: Diagnosis not present

## 2021-05-26 DIAGNOSIS — M549 Dorsalgia, unspecified: Secondary | ICD-10-CM | POA: Insufficient documentation

## 2021-05-26 DIAGNOSIS — Z8249 Family history of ischemic heart disease and other diseases of the circulatory system: Secondary | ICD-10-CM | POA: Diagnosis not present

## 2021-05-26 DIAGNOSIS — M2550 Pain in unspecified joint: Secondary | ICD-10-CM | POA: Insufficient documentation

## 2021-05-26 LAB — CBC WITH DIFFERENTIAL/PLATELET
Abs Immature Granulocytes: 0.01 K/uL (ref 0.00–0.07)
Basophils Absolute: 0.1 K/uL (ref 0.0–0.1)
Basophils Relative: 1 %
Eosinophils Absolute: 0.3 K/uL (ref 0.0–0.5)
Eosinophils Relative: 4 %
HCT: 39.6 % (ref 36.0–46.0)
Hemoglobin: 12.6 g/dL (ref 12.0–15.0)
Immature Granulocytes: 0 %
Lymphocytes Relative: 23 %
Lymphs Abs: 1.6 K/uL (ref 0.7–4.0)
MCH: 23.5 pg — ABNORMAL LOW (ref 26.0–34.0)
MCHC: 31.8 g/dL (ref 30.0–36.0)
MCV: 73.9 fL — ABNORMAL LOW (ref 80.0–100.0)
Monocytes Absolute: 0.4 K/uL (ref 0.1–1.0)
Monocytes Relative: 6 %
Neutro Abs: 4.5 K/uL (ref 1.7–7.7)
Neutrophils Relative %: 66 %
Platelets: 212 K/uL (ref 150–400)
RBC: 5.36 MIL/uL — ABNORMAL HIGH (ref 3.87–5.11)
RDW: 16.8 % — ABNORMAL HIGH (ref 11.5–15.5)
WBC: 6.9 K/uL (ref 4.0–10.5)
nRBC: 0 % (ref 0.0–0.2)

## 2021-05-26 MED ORDER — CYANOCOBALAMIN 1000 MCG/ML IJ SOLN
1000.0000 ug | Freq: Once | INTRAMUSCULAR | Status: AC
  Administered 2021-05-26: 1000 ug via INTRAMUSCULAR
  Filled 2021-05-26: qty 1

## 2021-05-26 NOTE — Progress Notes (Signed)
[facility] NOTE  Patient Care Team: Frazier Richards, MD as PCP - General (Family Medicine)  CHIEF COMPLAINTS/PURPOSE OF CONSULTATION: Thrombocytopenia/ANEMIA   HEMATOLOGY HISTORY  # July 2022- ANEMIA-THROMBOCYTOPENIA [platelets- 70-80s; WBC- ; Hb-7-8; dmitted to hospital for severe anemia-hemoglobin around 6.3.  Patient is s/p units of PRBC transfusion.  Patient noted to have B12 deficiency less than 50.   Vitamin B-12 232 - 1,245 pg/mL 615  <50 Low  R, CM     # Pituitary adenoma- s/p surgery- s/p RT- 5 weeks [[facility]; last RT in Feb 2022]  July 2022- No focal lesion identified. Within normal limits in parenchymal  echogenicity. Portal vein is patent on color Doppler imaging with normal direction of blood flow towards the liver; IMPRESSION:  Mildly dilated CBD which is usually normal after cholecystectomy. 2. Unremarkable appearance of the liver.   HISTORY OF PRESENTING ILLNESS: In a wheelchair because of obesity.  Accompanied by family. Ashley Alvarado 70 y.o.  female pleasant patient with severe B12 deficiency and  thrombocytopenia/anemia.  Patient is currently getting monthly B12 injections.  Feels better.     Review of Systems  Constitutional:  Positive for malaise/fatigue. Negative for chills, diaphoresis, fever and weight loss.  HENT:  Negative for nosebleeds and sore throat.   Eyes:  Negative for double vision.  Respiratory:  Negative for cough, hemoptysis, sputum production, shortness of breath and wheezing.   Cardiovascular:  Negative for chest pain, palpitations, orthopnea and leg swelling.  Gastrointestinal:  Negative for abdominal pain, blood in stool, constipation, diarrhea, heartburn, melena, nausea and vomiting.  Genitourinary:  Negative for dysuria, frequency and urgency.  Musculoskeletal:  Positive for back pain and joint pain.  Skin: Negative.  Negative for itching and rash.  Neurological:  Negative for dizziness, tingling, focal weakness,  weakness and headaches.  Endo/Heme/Allergies:  Does not bruise/bleed easily.  Psychiatric/Behavioral:  Negative for depression. The patient is not nervous/anxious and does not have insomnia.     MEDICAL HISTORY:  Past Medical History:  Diagnosis Date   Anemia    Glaucoma    Hypertension    Osteoarthritis     SURGICAL HISTORY: Past Surgical History:  Procedure Laterality Date   ABDOMINAL HYSTERECTOMY     BRAIN SURGERY     CHOLECYSTECTOMY     COLONOSCOPY N/A 02/18/2021   Procedure: COLONOSCOPY;  Surgeon: Jonathon Bellows, MD;  Location: [facility] ENDOSCOPY;  Service: Gastroenterology;  Laterality: N/A;   ESOPHAGOGASTRODUODENOSCOPY (EGD) WITH PROPOFOL N/A 02/18/2021   Procedure: ESOPHAGOGASTRODUODENOSCOPY (EGD) WITH PROPOFOL;  Surgeon: Jonathon Bellows, MD;  Location: [facility] ENDOSCOPY;  Service: Gastroenterology;  Laterality: N/A;    SOCIAL HISTORY: Social History   Socioeconomic History   Marital status: Divorced    Spouse name: Not on file   Number of children: Not on file   Years of education: Not on file   Highest education level: Not on file  Occupational History   Not on file  Tobacco Use   Smoking status: Never   Smokeless tobacco: Never  Substance and Sexual Activity   Alcohol use: No    Alcohol/week: 0.0 standard drinks   Drug use: No   Sexual activity: Not on file  Other Topics Concern   Not on file  Social History Narrative   Lives in [place] with son [place] with mom]. Never smoked; no alcohol. Worked in [employer]. Uses walker because of knee pain.    Social Determinants of Health   Financial Resource Strain: Not on file  Food Insecurity: Not on file  Transportation Needs: Not on file  Physical Activity: Not on file  Stress: Not on file  Social Connections: Not on file  Intimate Partner Violence: Not on file    FAMILY HISTORY: Family History  Problem Relation Age of Onset   Hypertension Father    Diabetes Mother    Hypertension Mother    Kidney disease  Mother    Breast cancer Neg Hx     ALLERGIES:  has No Known Allergies.  MEDICATIONS:  Current Outpatient Medications  Medication Sig Dispense Refill   albuterol (VENTOLIN HFA) 108 (90 Base) MCG/ACT inhaler Inhale 2 puffs into the lungs every 6 (six) hours as needed for shortness of breath.     amLODipine (NORVASC) 10 MG tablet Hold until followup with your PCP because your blood pressure was normal without any BP medications. (Patient taking differently: Take 10 mg by mouth daily.)     aspirin EC 81 MG tablet Take 81 mg by mouth daily.     hydrochlorothiazide (HYDRODIURIL) 25 MG tablet Hold until followup with your PCP because your blood pressure was normal without any BP medications. (Patient taking differently: Take 25 mg by mouth daily.)     latanoprost (XALATAN) 0.005 % ophthalmic solution Place 1 drop into both eyes at bedtime.     losartan (COZAAR) 100 MG tablet Hold until followup with your PCP because your blood pressure was normal without any BP medications. (Patient taking differently: Take 100 mg by mouth daily.)     Omega-3 1000 MG CAPS Take by mouth.     pantoprazole (PROTONIX) 40 MG tablet Take 1 tablet (40 mg total) by mouth daily. 30 tablet 0   cetirizine (ZYRTEC) 10 MG tablet Take 10 mg by mouth daily as needed for allergies or rhinitis. (Patient not taking: No sig reported)     ondansetron (ZOFRAN) 4 MG tablet Take 4 mg by mouth every 8 (eight) hours as needed for nausea. (Patient not taking: No sig reported)     No current facility-administered medications for this visit.     Marland Kitchen  PHYSICAL EXAMINATION:   Vitals:   05/26/21 1045  BP: 123/84  Pulse: 83  Resp: 20  Temp: 97.7 F (36.5 C)  SpO2: 99%   Filed Weights   05/26/21 1047  Weight: 298 lb (135.2 kg)    Physical Exam Vitals and nursing note reviewed.  HENT:     Head: Normocephalic and atraumatic.     Mouth/Throat:     Pharynx: Oropharynx is clear.  Eyes:     Extraocular Movements: Extraocular  movements intact.     Pupils: Pupils are equal, round, and reactive to light.  Cardiovascular:     Rate and Rhythm: Normal rate and regular rhythm.  Pulmonary:     Comments: Decreased breath sounds bilaterally.  Abdominal:     Palpations: Abdomen is soft.  Musculoskeletal:        General: Normal range of motion.     Cervical back: Normal range of motion.  Skin:    General: Skin is warm.  Neurological:     General: No focal deficit present.     Mental Status: She is alert and oriented to person, place, and time.  Psychiatric:        Behavior: Behavior normal.        Judgment: Judgment normal.     LABORATORY DATA:  I have reviewed the data as listed Lab Results  Component Value Date   WBC 6.9  05/26/2021   HGB 12.6 05/26/2021   HCT 39.6 05/26/2021   MCV 73.9 (L) 05/26/2021   PLT 212 05/26/2021   Recent Labs    02/15/21 1311 02/17/21 0430 02/18/21 0521 03/26/21 1346  NA 140 139 138  --   K 4.0 3.6 3.8  --   CL 106 104 102  --   CO2 27 29 28   --   GLUCOSE 184* 152* 174*  --   BUN 23 22 19   --   CREATININE 1.08* 1.03* 1.09*  --   CALCIUM 9.6 9.1 9.1  --   GFRNONAA 55* 58* 54*  --   PROT 7.1  --   --  7.0  ALBUMIN 4.2  --   --  4.1  AST 109*  --   --  32  ALT 48*  --   --  16  ALKPHOS 49  --   --  82  BILITOT 1.7*  --   --  0.4  BILIDIR 0.4*  --   --  0.13  IBILI 1.3*  --   --   --      No results found.  ASSESSMENT & PLAN:   Thrombocytopenia ([place]) #Anemia/thrombocytopenia-severe B12 deficiency-July 2022 B12 less than 50.  GI work-up negative for any bleeding. POSITIVE for  antiparietal antibodies; methylmalonic acid-WNL   #Severe B12 deficiency [July, 2022]-B12 < 50.  Continue B12 on a monthly basis.  #Morbid obesity-Walker/ bilateral Knee pain- off meloxicam [sec to gastritis; per pt]- on tylenol-  # DISPOSITION: # b12 injection- monthly x6  # follow up in 6 month- MD; labs- cbcbmp;B12 levels; iron studies/ferritin- and B12 injection-dr.B   All  questions were answered. The patient knows to call the clinic with any problems, questions or concerns.   Cammie Sickle, MD 05/26/2021 12:53 PM

## 2021-05-26 NOTE — Assessment & Plan Note (Addendum)
#  Anemia/thrombocytopenia-severe B12 deficiency-July 2022 B12 less than 50.  GI work-up negative for any bleeding. POSITIVE for  antiparietal antibodies; methylmalonic acid-WNL   #Severe B12 deficiency [July, 2022]-B12 < 50.  Continue B12 on a monthly basis.  #Morbid obesity-Walker/ bilateral Knee pain- off meloxicam [sec to gastritis; per pt]- on tylenol-  # DISPOSITION: # b12 injection- monthly x6  # follow up in 6 month- MD; labs- cbcbmp;B12 levels; iron studies/ferritin- and B12 injection-dr.B

## 2021-06-05 ENCOUNTER — Other Ambulatory Visit: Payer: Self-pay | Admitting: Family Medicine

## 2021-06-05 DIAGNOSIS — Z1231 Encounter for screening mammogram for malignant neoplasm of breast: Secondary | ICD-10-CM

## 2021-06-25 ENCOUNTER — Telehealth: Payer: Self-pay

## 2021-06-25 ENCOUNTER — Ambulatory Visit (INDEPENDENT_AMBULATORY_CARE_PROVIDER_SITE_OTHER): Payer: Medicare HMO | Admitting: Gastroenterology

## 2021-06-25 ENCOUNTER — Other Ambulatory Visit: Payer: Self-pay

## 2021-06-25 ENCOUNTER — Encounter: Payer: Self-pay | Admitting: Gastroenterology

## 2021-06-25 VITALS — BP 164/89 | HR 94 | Temp 97.4°F | Wt 299.4 lb

## 2021-06-25 DIAGNOSIS — D510 Vitamin B12 deficiency anemia due to intrinsic factor deficiency: Secondary | ICD-10-CM

## 2021-06-25 DIAGNOSIS — K317 Polyp of stomach and duodenum: Secondary | ICD-10-CM | POA: Diagnosis not present

## 2021-06-25 NOTE — Telephone Encounter (Signed)
Patient was informed that she would get called from Dr. Donneta Romberg office to schedule her EUS. Unfortunately, she can not have it done here at [facility] since there is a possibility that she will need a lesion removed. Patient understood and had no further questions.

## 2021-06-25 NOTE — Telephone Encounter (Signed)
Dr Rush Landmark please review for EUS

## 2021-06-25 NOTE — Patient Instructions (Signed)
We will send your referral for you to have an EUS scheduled. They will call you to schedule your procedure.

## 2021-06-25 NOTE — Progress Notes (Signed)
Jonathon Bellows MD, MRCP(U.K) [address]  [place]  [place], [place] [address]  Main: [phone]  Fax: [phone]   Primary Care Physician: Frazier Richards, MD  Primary Gastroenterologist:  Dr. Jonathon Bellows    Chief complaint: Anemia   HPI: Ashley Alvarado is a 70 y.o. female Summary of history :   She was seen by me when admitted on 02/15/2021 for anemia.  Chronic NSAID use.  She presented with weakness palpitations and bright red blood per rectum with associated constipation for over a month.  Hemoglobin was 6.3 g.  No evidence of iron deficiency.  LFTs were noted to be abnormal.  I performed an EGD and colonoscopy on 02/18/2021 and a 40 mm pedunculated submucosal lesion was found on the greater curvature of the stomach, Plan to refer for EUS.  Diffuse atrophic mucosa seen in the remaining part of the stomach.  Colonoscopy showed diverticulosis of the colon.  A 6 mm polyp in the distal rectum was resected.  Biopsies of the stomach showed no abnormalities.  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  Hemoglobin at discharge was 7.8 g, B12 was less than 50 folate was normal.  AST 109 ALT of 48 total bilirubin 1.7 predominantly indirect.  Right upper quadrant ultrasound showed unremarkable appearance of the liver   Interval history  03/26/2021-06/25/2021 05/26/2021: Hemoglobin 12.6 g and MCV of 73.9 03/26/2021: Liver work-up negative since iron studies are normal but had a low platelet count referred to hematology to evaluate further.  B12 level normalized at 615  05/26/2021 seen Dr. Rogue Bussing for severe anemia and low B12.  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   Current Outpatient Medications  Medication Sig Dispense Refill   albuterol (VENTOLIN HFA) 108 (90 Base) MCG/ACT inhaler Inhale 2 puffs into the lungs every 6 (six) hours as needed for shortness of breath.     amLODipine (NORVASC) 10 MG tablet Hold  until followup with your PCP because your blood pressure was normal without any BP medications. (Patient taking differently: Take 10 mg by mouth daily.)     aspirin EC 81 MG tablet Take 81 mg by mouth daily.     cetirizine (ZYRTEC) 10 MG tablet Take 10 mg by mouth daily as needed for allergies or rhinitis. (Patient not taking: No sig reported)     D3-50 1.25 MG (50000 UT) capsule Take 50,000 Units by mouth once a week.     hydrochlorothiazide (HYDRODIURIL) 25 MG tablet Hold until followup with your PCP because your blood pressure was normal without any BP medications. (Patient taking differently: Take 25 mg by mouth daily.)     latanoprost (XALATAN) 0.005 % ophthalmic solution Place 1 drop into both eyes at bedtime.     losartan (COZAAR) 100 MG tablet Hold until followup with your PCP because your blood pressure was normal without any BP medications. (Patient taking differently: Take 100 mg by mouth daily.)     Omega-3 1000 MG CAPS Take by mouth.     ondansetron (ZOFRAN) 4 MG tablet Take 4 mg by mouth every 8 (eight) hours as needed for nausea. (Patient not taking: No sig reported)     pantoprazole (PROTONIX) 40 MG tablet Take 1 tablet (40 mg total) by mouth daily. 30 tablet 0   No current facility-administered medications for this visit.    Allergies as of 06/25/2021   (No Known Allergies)  ROS:  General: Negative for anorexia, weight loss, fever, chills, fatigue, weakness. ENT: Negative for hoarseness, difficulty swallowing , nasal congestion. CV: Negative for chest pain, angina, palpitations, dyspnea on exertion, peripheral edema.  Respiratory: Negative for dyspnea at rest, dyspnea on exertion, cough, sputum, wheezing.  GI: See history of present illness. GU:  Negative for dysuria, hematuria, urinary incontinence, urinary frequency, nocturnal urination.  Endo: Negative for unusual weight change.    Physical Examination:   BP (!) 164/89   Pulse 94   Temp (!) 97.4 F (36.3 C)  (Oral)   Wt 299 lb 6.4 oz (135.8 kg)   BMI 49.82 kg/m   General: Well-nourished, well-developed in no acute distress.  Eyes: No icterus. Conjunctivae pink. Neuro: Alert and oriented x 3.  Grossly intact. Skin: Warm and dry, no jaundice.   Psych: Alert and cooperative, normal mood and affect.   Imaging Studies: No results found.  Assessment and Plan:   Ashley Alvarado is a 70 y.o. y/o female here today to follow-up for anemia.  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  Iron studies are normal.  Hemoglobin is improved with B12 supplementation 12.6 g following up with hematology. Previously had LFTs that were abnormal which have normalized.She had a submucosal lesion in the stomach on her upper endoscopy in July 2022 plan was referred for an EUS  Plan 1.  Requires EUS for submucosal lesion of the stomach will refer  Dr Jonathon Bellows  MD,MRCP [facility]) Follow up in 6 months

## 2021-06-25 NOTE — Telephone Encounter (Signed)
Patty, Please move forward with EUS radial/linear 60-minute. Next available with me. Not sure that this will be a lesion that needs to be resected but aspiration and evaluation first. Thanks. GM

## 2021-06-26 ENCOUNTER — Other Ambulatory Visit: Payer: Self-pay

## 2021-06-26 DIAGNOSIS — K317 Polyp of stomach and duodenum: Secondary | ICD-10-CM

## 2021-06-26 NOTE — Telephone Encounter (Signed)
EUS has been scheduled for EUS on 07/23/21 at 730 am at [facility] with GM.

## 2021-06-26 NOTE — Telephone Encounter (Signed)
EUS scheduled, pt instructed and medications reviewed.  Patient instructions mailed to home.  Patient to call with any questions or concerns.  

## 2021-06-26 NOTE — Telephone Encounter (Signed)
Left message on machine to call back  Instructions mailed to the pt home

## 2021-06-30 ENCOUNTER — Other Ambulatory Visit: Payer: Self-pay

## 2021-06-30 ENCOUNTER — Inpatient Hospital Stay: Payer: Medicare HMO | Attending: Internal Medicine

## 2021-06-30 DIAGNOSIS — E538 Deficiency of other specified B group vitamins: Secondary | ICD-10-CM | POA: Diagnosis present

## 2021-06-30 MED ORDER — CYANOCOBALAMIN 1000 MCG/ML IJ SOLN
1000.0000 ug | Freq: Once | INTRAMUSCULAR | Status: AC
  Administered 2021-06-30: 1000 ug via INTRAMUSCULAR
  Filled 2021-06-30: qty 1

## 2021-07-10 ENCOUNTER — Encounter (HOSPITAL_COMMUNITY): Payer: Self-pay | Admitting: Gastroenterology

## 2021-07-10 NOTE — Progress Notes (Signed)
Attempted to obtain medical history via telephone, unable to reach at this time. I left a voicemail to return pre surgical testing department's phone call.  

## 2021-07-23 ENCOUNTER — Ambulatory Visit (HOSPITAL_COMMUNITY): Payer: Medicare HMO | Admitting: Anesthesiology

## 2021-07-23 ENCOUNTER — Encounter (HOSPITAL_COMMUNITY)
Admission: RE | Disposition: A | Discharge status: Home / Self Care | Payer: Self-pay | Source: Home / Self Care | Attending: Gastroenterology

## 2021-07-23 ENCOUNTER — Ambulatory Visit (HOSPITAL_COMMUNITY)
Admission: RE | Admit: 2021-07-23 | Discharge: 2021-07-23 | Disposition: A | Discharge status: Home / Self Care | Payer: Medicare HMO | Attending: Gastroenterology | Admitting: Gastroenterology

## 2021-07-23 ENCOUNTER — Encounter (HOSPITAL_COMMUNITY): Payer: Self-pay | Admitting: Gastroenterology

## 2021-07-23 DIAGNOSIS — I10 Essential (primary) hypertension: Secondary | ICD-10-CM | POA: Diagnosis not present

## 2021-07-23 DIAGNOSIS — Z79899 Other long term (current) drug therapy: Secondary | ICD-10-CM | POA: Diagnosis not present

## 2021-07-23 DIAGNOSIS — K219 Gastro-esophageal reflux disease without esophagitis: Secondary | ICD-10-CM | POA: Diagnosis not present

## 2021-07-23 DIAGNOSIS — K319 Disease of stomach and duodenum, unspecified: Secondary | ICD-10-CM | POA: Diagnosis not present

## 2021-07-23 DIAGNOSIS — Z6841 Body Mass Index (BMI) 40.0 and over, adult: Secondary | ICD-10-CM | POA: Insufficient documentation

## 2021-07-23 DIAGNOSIS — D490 Neoplasm of unspecified behavior of digestive system: Secondary | ICD-10-CM

## 2021-07-23 DIAGNOSIS — K317 Polyp of stomach and duodenum: Secondary | ICD-10-CM

## 2021-07-23 DIAGNOSIS — K3189 Other diseases of stomach and duodenum: Secondary | ICD-10-CM | POA: Diagnosis present

## 2021-07-23 DIAGNOSIS — K449 Diaphragmatic hernia without obstruction or gangrene: Secondary | ICD-10-CM | POA: Insufficient documentation

## 2021-07-23 HISTORY — PX: EUS: SHX5427

## 2021-07-23 HISTORY — PX: ESOPHAGOGASTRODUODENOSCOPY (EGD) WITH PROPOFOL: SHX5813

## 2021-07-23 HISTORY — PX: BIOPSY: SHX5522

## 2021-07-23 SURGERY — ESOPHAGOGASTRODUODENOSCOPY (EGD) WITH PROPOFOL
Anesthesia: Monitor Anesthesia Care

## 2021-07-23 MED ORDER — SODIUM CHLORIDE 0.9 % IV SOLN: INTRAVENOUS | Status: DC

## 2021-07-23 MED ORDER — PROPOFOL 500 MG/50ML IV EMUL
INTRAVENOUS | Status: DC | PRN
  Administered 2021-07-23: 125 ug/kg/min via INTRAVENOUS

## 2021-07-23 MED ORDER — PHENYLEPHRINE 40 MCG/ML (10ML) SYRINGE FOR IV PUSH (FOR BLOOD PRESSURE SUPPORT)
PREFILLED_SYRINGE | INTRAVENOUS | Status: DC | PRN
  Administered 2021-07-23 (×2): 80 ug via INTRAVENOUS

## 2021-07-23 MED ORDER — LACTATED RINGERS IV SOLN
INTRAVENOUS | Status: AC | PRN
  Administered 2021-07-23: 20 mL/h via INTRAVENOUS

## 2021-07-23 MED ORDER — GLYCOPYRROLATE 0.2 MG/ML IJ SOLN
INTRAMUSCULAR | Status: DC | PRN
  Administered 2021-07-23: .1 mg via INTRAVENOUS

## 2021-07-23 SURGICAL SUPPLY — 15 items

## 2021-07-23 NOTE — Anesthesia Procedure Notes (Signed)
Procedure Name: MAC Date/Time: 07/23/2021 7:51 AM Performed by: Lieutenant Diego, CRNA Pre-anesthesia Checklist: Patient identified, Emergency Drugs available, Suction available, Patient being monitored and Timeout performed Patient Re-evaluated:Patient Re-evaluated prior to induction Oxygen Delivery Method: Simple face mask Preoxygenation: Pre-oxygenation with 100% oxygen Induction Type: IV induction

## 2021-07-23 NOTE — H&P (Signed)
GASTROENTEROLOGY PROCEDURE H&P NOTE   Primary Care Physician: Frazier Richards, MD  HPI: Ashley Alvarado is a 70 y.o. female who presents for EGD/EUS to evaluate the gastric abnormality on the recent EGD.  Past Medical History:  Diagnosis Date   Anemia    Glaucoma    Hypertension    Osteoarthritis    Past Surgical History:  Procedure Laterality Date   ABDOMINAL HYSTERECTOMY     BRAIN SURGERY     CHOLECYSTECTOMY     COLONOSCOPY N/A 02/18/2021   Procedure: COLONOSCOPY;  Surgeon: Jonathon Bellows, MD;  Location: [facility] ENDOSCOPY;  Service: Gastroenterology;  Laterality: N/A;   ESOPHAGOGASTRODUODENOSCOPY (EGD) WITH PROPOFOL N/A 02/18/2021   Procedure: ESOPHAGOGASTRODUODENOSCOPY (EGD) WITH PROPOFOL;  Surgeon: Jonathon Bellows, MD;  Location[facility] ENDOSCOPY;  Service: Gastroenterology;  Laterality: N/A;   Current Facility-Administered Medications  Medication Dose Route Frequency Provider Last Rate Last Admin   0.9 %  sodium chloride infusion   Intravenous Continuous Mansouraty, Telford Nab., MD       lactated ringers infusion    Continuous PRN Mansouraty, Telford Nab., MD 20 mL/hr at 07/23/21 0726 20 mL/hr at 07/23/21 0726    Current Facility-Administered Medications:    0.9 %  sodium chloride infusion, , Intravenous, Continuous, Mansouraty, Telford Nab., MD   lactated ringers infusion, , , Continuous PRN, Mansouraty, Telford Nab., MD, Last Rate: 20 mL/hr at 07/23/21 0726, 20 mL/hr at 07/23/21 0726 No Known Allergies Family History  Problem Relation Age of Onset   Hypertension Father    Diabetes Mother    Hypertension Mother    Kidney disease Mother    Breast cancer Neg Hx    Social History   Socioeconomic History   Marital status: Divorced    Spouse name: Not on file   Number of children: Not on file   Years of education: Not on file   Highest education level: Not on file  Occupational History   Not on file  Tobacco Use   Smoking status: Never   Smokeless tobacco: Never   Substance and Sexual Activity   Alcohol use: No    Alcohol/week: 0.0 standard drinks   Drug use: No   Sexual activity: Not on file  Other Topics Concern   Not on file  Social History Narrative   Lives in [place] with son [place] with mom]. Never smoked; no alcohol. Worked in [employer]. Uses walker because of knee pain.    Social Determinants of Health   Financial Resource Strain: Not on file  Food Insecurity: Not on file  Transportation Needs: Not on file  Physical Activity: Not on file  Stress: Not on file  Social Connections: Not on file  Intimate Partner Violence: Not on file    Physical Exam: Today's Vitals   07/23/21 0650  BP: 136/85  Pulse: 90  Resp: 14  Temp: 98 F (36.7 C)  TempSrc: Temporal  SpO2: 99%  Weight: 135.2 kg  Height: 5\' 5"  (1.651 m)  PainSc: 3    Body mass index is 49.59 kg/m. GEN: NAD EYE: Sclerae anicteric ENT: MMM CV: Non-tachycardic GI: Soft, NT/ND NEURO:  Alert & Oriented x 3  Lab Results: No results for input(s): WBC, HGB, HCT, PLT in the last 72 hours. BMET No results for input(s): NA, K, CL, CO2, GLUCOSE, BUN, CREATININE, CALCIUM in the last 72 hours. LFT No results for input(s): PROT, ALBUMIN, AST, ALT, ALKPHOS, BILITOT, BILIDIR, IBILI in the last 72 hours. PT/INR No results for input(s): LABPROT,  INR in the last 72 hours.   Impression / Plan: This is a 70 y.o.female who presents for EGD/EUS to evaluate the gastric abnormality on the recent EGD.  The risks of an EUS including intestinal perforation, bleeding, infection, aspiration, and medication effects were discussed as was the possibility it may not give a definitive diagnosis if a biopsy is performed.  When a biopsy of the pancreas is done as part of the EUS, there is an additional risk of pancreatitis at the rate of about 1-2%.  It was explained that procedure related pancreatitis is typically mild, although it can be severe and even life threatening, which is why we do  not perform random pancreatic biopsies and only biopsy a lesion/area we feel is concerning enough to warrant the risk.   The risks and benefits of endoscopic evaluation/treatment were discussed with the patient and/or family; these include but are not limited to the risk of perforation, infection, bleeding, missed lesions, lack of diagnosis, severe illness requiring hospitalization, as well as anesthesia and sedation related illnesses.  The patient's history has been reviewed, patient examined, no change in status, and deemed stable for procedure.  The patient and/or family is agreeable to proceed.    Justice Britain, MD [facility]

## 2021-07-23 NOTE — Transfer of Care (Signed)
Immediate Anesthesia Transfer of Care Note  Patient: Ashley Alvarado  Procedure(s) Performed: ESOPHAGOGASTRODUODENOSCOPY (EGD) WITH PROPOFOL UPPER ENDOSCOPIC ULTRASOUND (EUS) RADIAL BIOPSY  Patient Location: PACU  Anesthesia Type:MAC  Level of Consciousness: awake  Airway & Oxygen Therapy: Patient Spontanous Breathing and Patient connected to face mask oxygen  Post-op Assessment: Report given to RN and Post -op Vital signs reviewed and stable  Post vital signs: Reviewed and stable  Last Vitals:  Vitals Value Taken Time  BP 88/57 07/23/21 0817  Temp    Pulse 72 07/23/21 0817  Resp 25 07/23/21 0817  SpO2 99 % 07/23/21 0817  Vitals shown include unvalidated device data.  Last Pain:  Vitals:   07/23/21 0650  TempSrc: Temporal  PainSc: 3          Complications: No notable events documented.

## 2021-07-23 NOTE — Op Note (Signed)
[facility] Patient Name: Ashley Alvarado Procedure Date : 07/23/2021 MRN: [id number] Attending MD: Justice Britain , MD Date of Birth: 1951/05/17 CSN: [id number] Age: 70 Admit Type: Outpatient Procedure:                Upper EUS Indications:              Gastric mucosal mass/polyp found on endoscopy,                            Submucosal tumor versus extrinsic mass found on                            endoscopy Providers:                Justice Britain, MD, Grace Isaac, RN, Cherylynn Ridges, Technician Referring MD:             Jonathon Bellows MD, MD, Hattie Perch. [place] Medicines:                Monitored Anesthesia Care Complications:            No immediate complications. Estimated Blood Loss:     Estimated blood loss was minimal. Procedure:                Pre-Anesthesia Assessment:                           - Prior to the procedure, a History and Physical                            was performed, and patient medications and                            allergies were reviewed. The patient's tolerance of                            previous anesthesia was also reviewed. The risks                            and benefits of the procedure and the sedation                            options and risks were discussed with the patient.                            All questions were answered, and informed consent                            was obtained. Prior Anticoagulants: The patient has                            taken no previous anticoagulant or antiplatelet  agents except for aspirin. ASA Grade Assessment:                            III - A patient with severe systemic disease. After                            reviewing the risks and benefits, the patient was                            deemed in satisfactory condition to undergo the                            procedure.                           After obtaining informed  consent, the endoscope was                            passed under direct vision. Throughout the                            procedure, the patient's blood pressure, pulse, and                            oxygen saturations were monitored continuously. The                            [id number]-[id number] ([id number]) Olympus endoscope was                            introduced through the mouth, and advanced to the                            second part of duodenum. The [id number] ([id number])                            Olympus radial ultrasound scope was introduced                            through the mouth, and advanced to the stomach for                            ultrasound examination. The upper EUS was                            accomplished without difficulty. The patient                            tolerated the procedure. Scope In: Scope Out: Findings:      ENDOSCOPIC FINDING: :      No gross lesions were noted in the entire esophagus.      The Z-line was irregular and was found 36 cm from the incisors.      A 3 cm hiatal hernia was present.  A medium-sized, submucosal, non-circumferential lesion with no bleeding       and no stigmata of recent bleeding was found on the greater curvature of       the stomach.      Striped moderately erythematous mucosa without bleeding was found in the       gastric antrum.      No other gross lesions were noted in the entire examined stomach.       Biopsies were taken with a cold forceps for histology and Helicobacter       pylori testing.      No gross lesions were noted in the duodenal bulb, in the first portion       of the duodenum and in the second portion of the duodenum.      ENDOSONOGRAPHIC FINDING: :      A round intramural (subepithelial) lesion was found in the greater curve       of the stomach. The lesion was hyperechoic. Sonographically, the lesion       appeared to originate from the submucosa (Layer 3). The lesion measured       25 mm  (in maximum thickness). The lesion also measured 22 mm in       diameter. The outer endosonographic borders were well defined.      Endosonographic imaging in the visualized portion of the liver showed no       mass.      No malignant-appearing lymph nodes were visualized in the celiac region       (level 20) and perigastric region.      The celiac region was visualized. Impression:               EGD Impression:                           - No gross lesions in esophagus. Z-line irregular,                            36 cm from the incisors.                           - 3 cm hiatal hernia.                           - Gastric subepithelial lesion on the greater                            curvature of the stomach.                           - Erythematous mucosa in the antrum. No other gross                            lesions in the stomach. Biopsied for HP.                           - No gross lesions in the duodenal bulb, in the                            first portion of the duodenum and in  the second                            portion of the duodenum.                           EUS Impression:                           - An intramural (subepithelial) lesion was found in                            the greater curve of the stomach. The lesion                            appeared to originate from within the submucosa                            (Layer 3). Tissue has not been obtained. 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                           - No malignant-appearing lymph nodes were                            visualized in the celiac region (level 20) and                            perigastric region. Recommendation:           - The patient will be observed post-procedure,                            until all discharge criteria are met.                           - Discharge patient to home.                            - Patient has a contact number available for                            emergencies. The signs and symptoms of potential                            delayed complications were discussed with the                            patient. Return to normal activities tomorrow.  Written discharge instructions were provided to the                            patient.                           - Resume previous diet.                           - Observe patient's clinical course.                           - Await path results.                           - Return to referring physician as previously                            scheduled for any additional GI workup. If there is                            concern for enlargement of this lesion in the                            future based on any repeat endoscopies, then we                            would be happy to re-evaluate with EUS, but in most                            circumstances this does not need to be the case.                           - The findings and recommendations were discussed                            with the patient.                           - The findings and recommendations were discussed                            with the patient's family. Procedure Code(s):        --- Professional ---                           [phone], Esophagogastroduodenoscopy, flexible,                            transoral; with endoscopic ultrasound examination                            limited to the esophagus, stomach or duodenum, and  adjacent structures                           43239, Esophagogastroduodenoscopy, flexible,                            transoral; with biopsy, single or multiple Diagnosis Code(s):        --- Professional ---                           K22.8, Other specified diseases of esophagus                           K44.9, Diaphragmatic hernia without obstruction or                             gangrene                           D49.0, Neoplasm of unspecified behavior of                            digestive system                           K31.89, Other diseases of stomach and duodenum                           I89.9, Noninfective disorder of lymphatic vessels                            and lymph nodes, unspecified                           K92.9, Disease of digestive system, unspecified CPT copyright 2019 American Medical Association. All rights reserved. The codes documented in this report are preliminary and upon coder review may  be revised to meet current compliance requirements. Justice Britain, MD 07/23/2021 8:30:16 AM Number of Addenda: 0

## 2021-07-23 NOTE — Anesthesia Preprocedure Evaluation (Addendum)
Anesthesia Evaluation  Patient identified by MRN, date of birth, ID band Patient awake    Reviewed: Allergy & Precautions, NPO status , Patient's Chart, lab work & pertinent test results  History of Anesthesia Complications Negative for: history of anesthetic complications  Airway Mallampati: II  TM Distance: >3 FB Neck ROM: Full    Dental  (+) Missing, Dental Advisory Given   Pulmonary neg pulmonary ROS,    breath sounds clear to auscultation       Cardiovascular hypertension, Pt. on medications (-) angina Rhythm:Regular Rate:Normal     Neuro/Psych Glaucoma H/o pituitary adenoma    GI/Hepatic Neg liver ROS, GERD  Medicated and Controlled,  Endo/Other  Morbid obesity  Renal/GU negative Renal ROS     Musculoskeletal  (+) Arthritis ,   Abdominal (+) + obese,   Peds  Hematology negative hematology ROS (+)   Anesthesia Other Findings   Reproductive/Obstetrics                            Anesthesia Physical Anesthesia Plan  ASA: 3  Anesthesia Plan: MAC   Post-op Pain Management:    Induction:   PONV Risk Score and Plan: 2 and Ondansetron and Treatment may vary due to age or medical condition  Airway Management Planned: Natural Airway and Nasal Cannula  Additional Equipment: None  Intra-op Plan:   Post-operative Plan:   Informed Consent: I have reviewed the patients History and Physical, chart, labs and discussed the procedure including the risks, benefits and alternatives for the proposed anesthesia with the patient or authorized representative who has indicated his/her understanding and acceptance.     Dental advisory given  Plan Discussed with: CRNA and Surgeon  Anesthesia Plan Comments:        Anesthesia Quick Evaluation

## 2021-07-23 NOTE — Anesthesia Postprocedure Evaluation (Signed)
Anesthesia Post Note  Patient: Ashley Alvarado  Procedure(s) Performed: ESOPHAGOGASTRODUODENOSCOPY (EGD) WITH PROPOFOL UPPER ENDOSCOPIC ULTRASOUND (EUS) RADIAL BIOPSY     Patient location during evaluation: PACU Anesthesia Type: MAC Level of consciousness: awake and alert, patient cooperative and oriented Pain management: pain level controlled Respiratory status: nonlabored ventilation, spontaneous breathing and respiratory function stable Cardiovascular status: blood pressure returned to baseline and stable Postop Assessment: no apparent nausea or vomiting Anesthetic complications: no   No notable events documented.  Last Vitals:  Vitals:   07/23/21 0817 07/23/21 0837  BP:  117/64  Pulse:  66  Resp:  11  Temp: 36.6 C 36.7 C  SpO2:  97%    Last Pain:  Vitals:   07/23/21 0817  TempSrc:   PainSc: 0-No pain                 Ariell Gunnels,E. Cooper Stamp

## 2021-07-26 ENCOUNTER — Encounter (HOSPITAL_COMMUNITY): Payer: Self-pay | Admitting: Gastroenterology

## 2021-07-28 ENCOUNTER — Other Ambulatory Visit: Payer: Self-pay

## 2021-07-28 ENCOUNTER — Inpatient Hospital Stay: Payer: Medicare HMO | Attending: Internal Medicine

## 2021-07-28 DIAGNOSIS — E538 Deficiency of other specified B group vitamins: Secondary | ICD-10-CM | POA: Diagnosis present

## 2021-07-28 MED ORDER — CYANOCOBALAMIN 1000 MCG/ML IJ SOLN
1000.0000 ug | Freq: Once | INTRAMUSCULAR | Status: AC
  Administered 2021-07-28: 1000 ug via INTRAMUSCULAR
  Filled 2021-07-28: qty 1

## 2021-07-30 ENCOUNTER — Encounter: Payer: Self-pay | Admitting: Gastroenterology

## 2021-07-30 LAB — SURGICAL PATHOLOGY

## 2021-08-24 ENCOUNTER — Encounter: Payer: Self-pay | Admitting: Ophthalmology

## 2021-09-01 ENCOUNTER — Encounter (INDEPENDENT_AMBULATORY_CARE_PROVIDER_SITE_OTHER): Payer: Self-pay

## 2021-09-01 ENCOUNTER — Inpatient Hospital Stay: Payer: Medicare HMO | Attending: Internal Medicine

## 2021-09-01 ENCOUNTER — Other Ambulatory Visit: Payer: Self-pay

## 2021-09-01 DIAGNOSIS — E538 Deficiency of other specified B group vitamins: Secondary | ICD-10-CM | POA: Diagnosis not present

## 2021-09-01 MED ORDER — CYANOCOBALAMIN 1000 MCG/ML IJ SOLN
1000.0000 ug | Freq: Once | INTRAMUSCULAR | Status: AC
  Administered 2021-09-01: 1000 ug via INTRAMUSCULAR
  Filled 2021-09-01: qty 1

## 2021-09-03 NOTE — Discharge Instructions (Signed)

## 2021-09-08 ENCOUNTER — Ambulatory Visit: Payer: Medicare HMO | Admitting: Anesthesiology

## 2021-09-08 ENCOUNTER — Ambulatory Visit
Admission: RE | Admit: 2021-09-08 | Discharge: 2021-09-08 | Disposition: A | Discharge status: Home / Self Care | Payer: Medicare HMO | Attending: Ophthalmology | Admitting: Ophthalmology

## 2021-09-08 ENCOUNTER — Other Ambulatory Visit: Payer: Self-pay

## 2021-09-08 ENCOUNTER — Encounter
Admission: RE | Disposition: A | Discharge status: Home / Self Care | Payer: Self-pay | Source: Home / Self Care | Attending: Ophthalmology

## 2021-09-08 DIAGNOSIS — H409 Unspecified glaucoma: Secondary | ICD-10-CM | POA: Insufficient documentation

## 2021-09-08 DIAGNOSIS — M1990 Unspecified osteoarthritis, unspecified site: Secondary | ICD-10-CM | POA: Diagnosis not present

## 2021-09-08 DIAGNOSIS — I10 Essential (primary) hypertension: Secondary | ICD-10-CM | POA: Diagnosis not present

## 2021-09-08 DIAGNOSIS — H2511 Age-related nuclear cataract, right eye: Secondary | ICD-10-CM | POA: Diagnosis not present

## 2021-09-08 DIAGNOSIS — K219 Gastro-esophageal reflux disease without esophagitis: Secondary | ICD-10-CM | POA: Diagnosis not present

## 2021-09-08 DIAGNOSIS — Z6841 Body Mass Index (BMI) 40.0 and over, adult: Secondary | ICD-10-CM | POA: Diagnosis not present

## 2021-09-08 DIAGNOSIS — J45909 Unspecified asthma, uncomplicated: Secondary | ICD-10-CM | POA: Diagnosis not present

## 2021-09-08 HISTORY — PX: CATARACT EXTRACTION W/PHACO: SHX586

## 2021-09-08 HISTORY — DX: Prediabetes: R73.03

## 2021-09-08 HISTORY — DX: Benign neoplasm of pituitary gland: D35.2

## 2021-09-08 HISTORY — DX: Other complications of anesthesia, initial encounter: T88.59XA

## 2021-09-08 SURGERY — PHACOEMULSIFICATION, CATARACT, WITH IOL INSERTION
Anesthesia: Monitor Anesthesia Care | Site: Eye | Laterality: Right

## 2021-09-08 MED ORDER — SIGHTPATH DOSE#1 NA CHONDROIT SULF-NA HYALURON 40-17 MG/ML IO SOLN
INTRAOCULAR | Status: DC | PRN
  Administered 2021-09-08: 1 mL via INTRAOCULAR

## 2021-09-08 MED ORDER — SIGHTPATH DOSE#1 BSS IO SOLN
INTRAOCULAR | Status: DC | PRN
  Administered 2021-09-08: 2 mL

## 2021-09-08 MED ORDER — ARMC OPHTHALMIC DILATING DROPS
1.0000 "application " | OPHTHALMIC | Status: DC | PRN
  Administered 2021-09-08 (×3): 1 via OPHTHALMIC

## 2021-09-08 MED ORDER — FENTANYL CITRATE (PF) 100 MCG/2ML IJ SOLN
INTRAMUSCULAR | Status: DC | PRN
Start: 2021-09-08 — End: 2021-09-08
  Administered 2021-09-08: 50 ug via INTRAVENOUS

## 2021-09-08 MED ORDER — TETRACAINE HCL 0.5 % OP SOLN
1.0000 [drp] | OPHTHALMIC | Status: DC | PRN
  Administered 2021-09-08 (×3): 1 [drp] via OPHTHALMIC

## 2021-09-08 MED ORDER — TRYPAN BLUE 0.06 % IO SOSY
PREFILLED_SYRINGE | INTRAOCULAR | Status: DC | PRN
  Administered 2021-09-08: 0.5 mL via INTRAOCULAR

## 2021-09-08 MED ORDER — BRIMONIDINE TARTRATE-TIMOLOL 0.2-0.5 % OP SOLN
OPHTHALMIC | Status: DC | PRN
  Administered 2021-09-08: 1 [drp] via OPHTHALMIC

## 2021-09-08 MED ORDER — MIDAZOLAM HCL 2 MG/2ML IJ SOLN
INTRAMUSCULAR | Status: DC | PRN
  Administered 2021-09-08: 1 mg via INTRAVENOUS

## 2021-09-08 MED ORDER — SIGHTPATH DOSE#1 BSS IO SOLN
INTRAOCULAR | Status: DC | PRN
  Administered 2021-09-08: 15 mL via INTRAOCULAR

## 2021-09-08 MED ORDER — MOXIFLOXACIN HCL 0.5 % OP SOLN
OPHTHALMIC | Status: DC | PRN
  Administered 2021-09-08: 0.2 mL via OPHTHALMIC

## 2021-09-08 MED ORDER — SIGHTPATH DOSE#1 BSS IO SOLN
INTRAOCULAR | Status: DC | PRN
  Administered 2021-09-08: 57 mL via OPHTHALMIC

## 2021-09-08 MED ORDER — LACTATED RINGERS IV SOLN: INTRAVENOUS | Status: DC

## 2021-09-08 SURGICAL SUPPLY — 12 items
CANNULA ANT/CHMB 27G (MISCELLANEOUS) IMPLANT
CANNULA ANT/CHMB 27GA (MISCELLANEOUS) ×2 IMPLANT
CATARACT SUITE SIGHTPATH (MISCELLANEOUS) ×2 IMPLANT
FEE CATARACT SUITE SIGHTPATH (MISCELLANEOUS) ×1 IMPLANT
GLOVE SURG ENC TEXT LTX SZ8 (GLOVE) ×2 IMPLANT
GLOVE SURG TRIUMPH 8.0 PF LTX (GLOVE) ×2 IMPLANT
LENS IOL TECNIS EYHANCE 20.5 (Intraocular Lens) ×1 IMPLANT
NDL FILTER BLUNT 18X1 1/2 (NEEDLE) ×1 IMPLANT
NEEDLE FILTER BLUNT 18X 1/2SAF (NEEDLE) ×1
NEEDLE FILTER BLUNT 18X1 1/2 (NEEDLE) ×1 IMPLANT
SYR 3ML LL SCALE MARK (SYRINGE) ×2 IMPLANT
WATER STERILE IRR 250ML POUR (IV SOLUTION) ×2 IMPLANT

## 2021-09-08 NOTE — Anesthesia Procedure Notes (Signed)
Procedure Name: MAC Date/Time: 09/08/2021 10:59 AM Performed by: Cameron Ali, CRNA Pre-anesthesia Checklist: Patient identified, Emergency Drugs available, Suction available, Timeout performed and Patient being monitored Patient Re-evaluated:Patient Re-evaluated prior to induction Oxygen Delivery Method: Nasal cannula Placement Confirmation: positive ETCO2

## 2021-09-08 NOTE — Anesthesia Preprocedure Evaluation (Signed)
Anesthesia Evaluation  Patient identified by MRN, date of birth, ID band Patient awake    Reviewed: Allergy & Precautions, NPO status , Patient's Chart, lab work & pertinent test results  Airway Mallampati: II  TM Distance: >3 FB Neck ROM: Full    Dental  (+) Missing, Dental Advisory Given   Pulmonary asthma ,    Pulmonary exam normal        Cardiovascular hypertension, Pt. on medications  Rhythm:Regular Rate:Normal     Neuro/Psych Glaucoma  H/o pituitary adenoma s/p resection in 2007 and 2017 negative psych ROS   GI/Hepatic Neg liver ROS, GERD  Medicated and Controlled,  Endo/Other  Morbid obesity  Renal/GU negative Renal ROS     Musculoskeletal  (+) Arthritis ,   Abdominal (+) + obese,   Peds  Hematology negative hematology ROS (+)   Anesthesia Other Findings   Reproductive/Obstetrics                             Anesthesia Physical  Anesthesia Plan  ASA: 3  Anesthesia Plan: MAC   Post-op Pain Management: Minimal or no pain anticipated   Induction: Intravenous  PONV Risk Score and Plan: 2 and Treatment may vary due to age or medical condition, TIVA and Midazolam  Airway Management Planned: Natural Airway and Nasal Cannula  Additional Equipment: None  Intra-op Plan:   Post-operative Plan:   Informed Consent: I have reviewed the patients History and Physical, chart, labs and discussed the procedure including the risks, benefits and alternatives for the proposed anesthesia with the patient or authorized representative who has indicated his/her understanding and acceptance.     Dental advisory given  Plan Discussed with: CRNA  Anesthesia Plan Comments:         Anesthesia Quick Evaluation

## 2021-09-08 NOTE — H&P (Signed)
[place]   Primary Care Physician:  Frazier Richards, MD Ophthalmologist: Dr. George Ina  Pre-Procedure History & Physical: HPI:  Ashley Alvarado is a 71 y.o. female here for cataract surgery.   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   CHOLECYSTECTOMY     COLONOSCOPY N/A 02/18/2021   Procedure: COLONOSCOPY;  Surgeon: Jonathon Bellows, MD;  Location[facility] ENDOSCOPY;  Service: Gastroenterology;  Laterality: N/A;   ESOPHAGOGASTRODUODENOSCOPY (EGD) WITH PROPOFOL N/A 02/18/2021   Procedure: ESOPHAGOGASTRODUODENOSCOPY (EGD) WITH PROPOFOL;  Surgeon: Jonathon Bellows, MD;  Location: [facility] ENDOSCOPY;  Service: Gastroenterology;  Laterality: N/A;   ESOPHAGOGASTRODUODENOSCOPY (EGD) WITH PROPOFOL N/A 07/23/2021   Procedure: ESOPHAGOGASTRODUODENOSCOPY (EGD) WITH PROPOFOL;  Surgeon: Rush Landmark Telford Nab., MD;  Location: [place];  Service: Gastroenterology;  Laterality: N/A;   EUS N/A 07/23/2021   Procedure: UPPER ENDOSCOPIC ULTRASOUND (EUS) RADIAL;  Surgeon: Irving Copas., MD;  Location: [place];  Service: Gastroenterology;  Laterality: N/A;   PITUITARY SURGERY  11/2005   PITUITARY SURGERY  02/2016    Prior to Admission medications   Medication Sig Start Date End Date Taking? Authorizing Provider  albuterol (VENTOLIN HFA) 108 (90 Base) MCG/ACT inhaler Inhale 2 puffs into the lungs every 6 (six) hours as needed for shortness of breath. 09/18/12  Yes [provider]  amLODipine (NORVASC) 10 MG tablet Hold until followup with your PCP because your blood pressure was normal without any  BP medications. Patient taking differently: Take 10 mg by mouth daily. 02/18/21  Yes Enzo Bi, MD  aspirin EC 81 MG tablet Take 81 mg by mouth daily.   Yes [provider]  baclofen (LIORESAL) 10 MG tablet Take 10 mg by mouth daily as needed for muscle spasms.   Yes [provider]  celecoxib (CELEBREX) 100 MG capsule Take 100 mg by mouth 2 (two) times daily.   Yes [provider]  cetirizine (ZYRTEC) 10 MG tablet Take 10 mg by mouth daily as needed for allergies or rhinitis. 10/14/20  Yes [provider]  cyanocobalamin (,VITAMIN B-12,) 1000 MCG/ML injection Inject 1,000 mcg into the muscle every 30 (thirty) days.   Yes [provider]  hydrochlorothiazide (HYDRODIURIL) 25 MG tablet Hold until followup with your PCP because your blood pressure was normal without any BP medications. Patient taking differently: Take 25 mg by mouth daily. 02/18/21  Yes Enzo Bi, MD  latanoprost (XALATAN) 0.005 % ophthalmic solution Place 1 drop into both eyes at bedtime. 11/13/12  Yes [provider]  losartan (COZAAR) 100 MG tablet Hold until followup with your PCP because your blood pressure was normal without any BP medications. Patient taking differently: Take 100 mg by mouth daily. 02/18/21  Yes Enzo Bi, MD  Menthol-Camphor (ICY HOT ADVANCED PAIN RELIEF) 16-11 % CREA Apply 1 application topically daily as needed (knee pain).   Yes [provider]  Omega-3 1000 MG CAPS Take 1,000 mg by mouth in the morning and at bedtime. 02/25/21  Yes [provider]  pantoprazole (PROTONIX) 40 MG tablet Take 40  mg by mouth daily.   Yes [provider]  sodium chloride (OCEAN) 0.65 % SOLN nasal spray Place 1 spray into both nostrils as needed for congestion.   Yes [provider]  Vitamin D, Ergocalciferol, (DRISDOL) 1.25 MG (50000 UNIT) CAPS capsule Take 50,000 Units by mouth every Thursday.   Yes [provider]  ondansetron (ZOFRAN)  4 MG tablet Take 4 mg by mouth every 8 (eight) hours as needed for nausea. Patient not taking: Reported on 08/24/2021 01/28/21   [provider]    Allergies as of 08/05/2021   (No Known Allergies)    Family History  Problem Relation Age of Onset   Hypertension Father    Diabetes Mother    Hypertension Mother    Kidney disease Mother    Breast cancer Neg Hx     Social History   Socioeconomic History   Marital status: Divorced    Spouse name: Not on file   Number of children: Not on file   Years of education: Not on file   Highest education level: Not on file  Occupational History   Not on file  Tobacco Use   Smoking status: Never   Smokeless tobacco: Never  Substance and Sexual Activity   Alcohol use: No    Alcohol/week: 0.0 standard drinks   Drug use: No   Sexual activity: Not on file  Other Topics Concern   Not on file  Social History Narrative   Lives in [place] with son [place] with mom]. Never smoked; no alcohol. Worked in [employer]. Uses walker because of knee pain.    Social Determinants of Health   Financial Resource Strain: Not on file  Food Insecurity: Not on file  Transportation Needs: Not on file  Physical Activity: Not on file  Stress: Not on file  Social Connections: Not on file  Intimate Partner Violence: Not on file    Review of Systems: See HPI, otherwise negative ROS  Physical Exam: BP (!) 143/87    Pulse (!) 103    Temp 97.9 F (36.6 C) (Temporal)    Resp (!) 22    Ht 5\' 5"  (1.651 m)    Wt 135 kg    SpO2 94%    BMI 49.53 kg/m  General:   Alert, cooperative in NAD Head:  Normocephalic and atraumatic. Respiratory:  Normal work of breathing. Cardiovascular:  RRR  Impression/Plan: Ashley Alvarado is here for cataract surgery.  Risks, benefits, limitations, and alternatives regarding cataract surgery have been reviewed with the patient.  Questions have been answered.  All parties agreeable.   Birder Robson, MD   09/08/2021, 10:40 AM

## 2021-09-08 NOTE — Op Note (Signed)
PREOPERATIVE DIAGNOSIS:  Nuclear sclerotic cataract of the right eye.   POSTOPERATIVE DIAGNOSIS:  Cataract   OPERATIVE PROCEDURE:ORPROCALL@   SURGEON:  Birder Robson, MD.   ANESTHESIA:  Anesthesiologist: Ardeth Sportsman, MD CRNA: Cameron Ali, CRNA  1.      Managed anesthesia care. 2.      0.44ml of Shugarcaine was instilled in the eye following the paracentesis.   COMPLICATIONS: Vision Blue was used to stain the anterior capsule due to very poor/ no visualization of the red reflex.    TECHNIQUE:   Stop and chop   DESCRIPTION OF PROCEDURE:  The patient was examined and consented in the preoperative holding area where the aforementioned topical anesthesia was applied to the right eye and then brought back to the Operating Room where the right eye was prepped and draped in the usual sterile ophthalmic fashion and a lid speculum was placed. A paracentesis was created with the side port blade and the anterior chamber was filled with viscoelastic. A near clear corneal incision was performed with the steel keratome. A continuous curvilinear capsulorrhexis was performed with a cystotome followed by the capsulorrhexis forceps. Hydrodissection and hydrodelineation were carried out with BSS on a blunt cannula. The lens was removed in a stop and chop  technique and the remaining cortical material was removed with the irrigation-aspiration handpiece. The capsular bag was inflated with viscoelastic and the Technis [id number]  lens was placed in the capsular bag without complication. The remaining viscoelastic was removed from the eye with the irrigation-aspiration handpiece. The wounds were hydrated. The anterior chamber was flushed with BSS and the eye was inflated to physiologic pressure. 0.73ml of Vigamox was placed in the anterior chamber. The wounds were found to be water tight. The eye was dressed with Combigan. The patient was given protective glasses to wear throughout the day and a shield with which to  sleep tonight. The patient was also given drops with which to begin a drop regimen today and will follow-up with me in one day. Implant Name Type Inv. Item Serial No. Manufacturer Lot No. LRB No. Used Action  LENS IOL TECNIS EYHANCE 20.5 - [id number] Intraocular Lens LENS IOL TECNIS EYHANCE 20.5 [id number] [employer]  Right 1 Implanted   Procedure(s): CATARACT EXTRACTION PHACO AND INTRAOCULAR LENS PLACEMENT (IOC) RIGHT VISION BLUE 6.61 00:55.1 (Right)  Electronically signed: Birder Robson 09/08/2021 11:12 AM

## 2021-09-08 NOTE — Transfer of Care (Signed)
Immediate Anesthesia Transfer of Care Note  Patient: Ashley Alvarado  Procedure(s) Performed: CATARACT EXTRACTION PHACO AND INTRAOCULAR LENS PLACEMENT (IOC) RIGHT VISION BLUE 6.61 00:55.1 (Right: Eye)  Patient Location: PACU  Anesthesia Type: MAC  Level of Consciousness: awake, alert  and patient cooperative  Airway and Oxygen Therapy: Patient Spontanous Breathing and Patient connected to supplemental oxygen  Post-op Assessment: Post-op Vital signs reviewed, Patient's Cardiovascular Status Stable, Respiratory Function Stable, Patent Airway and No signs of Nausea or vomiting  Post-op Vital Signs: Reviewed and stable  Complications: No notable events documented.

## 2021-09-08 NOTE — Anesthesia Postprocedure Evaluation (Signed)
Anesthesia Post Note  Patient: Ashley Alvarado  Procedure(s) Performed: CATARACT EXTRACTION PHACO AND INTRAOCULAR LENS PLACEMENT (IOC) RIGHT VISION BLUE 6.61 00:55.1 (Right: Eye)     Patient location during evaluation: PACU Anesthesia Type: MAC Level of consciousness: awake and alert Pain management: pain level controlled Vital Signs Assessment: post-procedure vital signs reviewed and stable Respiratory status: spontaneous breathing and nonlabored ventilation Cardiovascular status: blood pressure returned to baseline Postop Assessment: no apparent nausea or vomiting Anesthetic complications: no   No notable events documented.  Vinicio Lynk [employer]

## 2021-09-09 ENCOUNTER — Encounter: Payer: Self-pay | Admitting: Ophthalmology

## 2021-09-21 ENCOUNTER — Ambulatory Visit
Admission: RE | Admit: 2021-09-21 | Discharge: 2021-09-21 | Disposition: A | Discharge status: Home / Self Care | Payer: Medicare HMO | Source: Ambulatory Visit | Attending: Family Medicine | Admitting: Family Medicine

## 2021-09-21 ENCOUNTER — Other Ambulatory Visit: Payer: Self-pay

## 2021-09-21 DIAGNOSIS — Z1231 Encounter for screening mammogram for malignant neoplasm of breast: Secondary | ICD-10-CM | POA: Diagnosis not present

## 2021-09-29 ENCOUNTER — Other Ambulatory Visit: Payer: Self-pay

## 2021-09-29 ENCOUNTER — Inpatient Hospital Stay: Payer: Medicare HMO | Attending: Internal Medicine

## 2021-09-29 DIAGNOSIS — Z79899 Other long term (current) drug therapy: Secondary | ICD-10-CM | POA: Diagnosis not present

## 2021-09-29 DIAGNOSIS — E538 Deficiency of other specified B group vitamins: Secondary | ICD-10-CM | POA: Diagnosis present

## 2021-09-29 MED ORDER — CYANOCOBALAMIN 1000 MCG/ML IJ SOLN
1000.0000 ug | Freq: Once | INTRAMUSCULAR | Status: AC
  Administered 2021-09-29: 1000 ug via INTRAMUSCULAR
  Filled 2021-09-29: qty 1

## 2021-10-27 ENCOUNTER — Inpatient Hospital Stay: Payer: Medicare HMO | Attending: Internal Medicine

## 2021-10-27 DIAGNOSIS — E538 Deficiency of other specified B group vitamins: Secondary | ICD-10-CM | POA: Diagnosis not present

## 2021-10-27 MED ORDER — CYANOCOBALAMIN 1000 MCG/ML IJ SOLN
1000.0000 ug | Freq: Once | INTRAMUSCULAR | Status: AC
  Administered 2021-10-27: 1000 ug via INTRAMUSCULAR
  Filled 2021-10-27: qty 1

## 2021-11-24 ENCOUNTER — Inpatient Hospital Stay: Payer: Medicare HMO | Attending: Internal Medicine

## 2021-11-24 ENCOUNTER — Encounter: Payer: Self-pay | Admitting: Internal Medicine

## 2021-11-24 ENCOUNTER — Inpatient Hospital Stay: Payer: Medicare HMO

## 2021-11-24 ENCOUNTER — Inpatient Hospital Stay (HOSPITAL_BASED_OUTPATIENT_CLINIC_OR_DEPARTMENT_OTHER): Payer: Medicare HMO | Admitting: Internal Medicine

## 2021-11-24 DIAGNOSIS — D649 Anemia, unspecified: Secondary | ICD-10-CM | POA: Insufficient documentation

## 2021-11-24 DIAGNOSIS — M25562 Pain in left knee: Secondary | ICD-10-CM | POA: Diagnosis not present

## 2021-11-24 DIAGNOSIS — E538 Deficiency of other specified B group vitamins: Secondary | ICD-10-CM

## 2021-11-24 DIAGNOSIS — D696 Thrombocytopenia, unspecified: Secondary | ICD-10-CM | POA: Insufficient documentation

## 2021-11-24 DIAGNOSIS — Z86018 Personal history of other benign neoplasm: Secondary | ICD-10-CM | POA: Insufficient documentation

## 2021-11-24 DIAGNOSIS — M25561 Pain in right knee: Secondary | ICD-10-CM | POA: Diagnosis not present

## 2021-11-24 LAB — BASIC METABOLIC PANEL
Anion gap: 6 (ref 5–15)
BUN: 17 mg/dL (ref 8–23)
CO2: 29 mmol/L (ref 22–32)
Calcium: 9.2 mg/dL (ref 8.9–10.3)
Chloride: 100 mmol/L (ref 98–111)
Creatinine, Ser: 0.59 mg/dL (ref 0.44–1.00)
GFR, Estimated: >60 mL/min (ref >60)
Glucose, Bld: 133 mg/dL — ABNORMAL HIGH (ref 70–99)
Potassium: 3.4 mmol/L — ABNORMAL LOW (ref 3.5–5.1)
Sodium: 135 mmol/L (ref 135–145)

## 2021-11-24 LAB — CBC WITH DIFFERENTIAL/PLATELET
Abs Immature Granulocytes: 0.03 10*3/uL (ref 0.00–0.07)
Basophils Absolute: 0.1 10*3/uL (ref 0.0–0.1)
Basophils Relative: 1 %
Eosinophils Absolute: 0.2 10*3/uL (ref 0.0–0.5)
Eosinophils Relative: 4 %
HCT: 42.3 % (ref 36.0–46.0)
Hemoglobin: 13.5 g/dL (ref 12.0–15.0)
Immature Granulocytes: 1 %
Lymphocytes Relative: 22 %
Lymphs Abs: 1.3 10*3/uL (ref 0.7–4.0)
MCH: 24.8 pg — ABNORMAL LOW (ref 26.0–34.0)
MCHC: 31.9 g/dL (ref 30.0–36.0)
MCV: 77.8 fL — ABNORMAL LOW (ref 80.0–100.0)
Monocytes Absolute: 0.4 10*3/uL (ref 0.1–1.0)
Monocytes Relative: 6 %
Neutro Abs: 4 10*3/uL (ref 1.7–7.7)
Neutrophils Relative %: 66 %
Platelets: 199 10*3/uL (ref 150–400)
RBC: 5.44 MIL/uL — ABNORMAL HIGH (ref 3.87–5.11)
RDW: 16.7 % — ABNORMAL HIGH (ref 11.5–15.5)
WBC: 6.1 10*3/uL (ref 4.0–10.5)
nRBC: 0 % (ref 0.0–0.2)

## 2021-11-24 LAB — IRON AND TIBC
Iron: 68 ug/dL (ref 28–170)
Saturation Ratios: 23 % (ref 10.4–31.8)
TIBC: 294 ug/dL (ref 250–450)
UIBC: 226 ug/dL

## 2021-11-24 LAB — FERRITIN: Ferritin: 106 ng/mL (ref 11–307)

## 2021-11-24 LAB — VITAMIN B12: Vitamin B-12: 84 pg/mL — ABNORMAL LOW (ref 180–914)

## 2021-11-24 MED ORDER — CYANOCOBALAMIN 1000 MCG/ML IJ SOLN
1000.0000 ug | Freq: Once | INTRAMUSCULAR | Status: AC
  Administered 2021-11-24: 1000 ug via INTRAMUSCULAR
  Filled 2021-11-24: qty 1

## 2021-11-24 NOTE — Progress Notes (Signed)
[name] ? ?Patient Care Team: ?Ashley Richards, MD as PCP - General (Family Medicine) ? ?CHIEF COMPLAINTS/PURPOSE OF CONSULTATION: Thrombocytopenia/ANEMIA ? ? ?HEMATOLOGY HISTORY ? ?# July 2022- ANEMIA-THROMBOCYTOPENIA [platelets- 70-80s; WBC- ; Hb-7-8; dmitted to hospital for severe anemia-hemoglobin around 6.3.  Patient is s/p units of PRBC transfusion.  Patient noted to have B12 deficiency less than 50.  ? ?Vitamin B-12 232 - 1,245 pg/mL 615  <50 Low  R, CM   ? ? ?# Pituitary adenoma- s/p surgery- s/p RT- 5 weeks [[facility]; last RT in Feb 2022] ? ?July 2022- No focal lesion identified. Within normal limits in parenchymal  echogenicity. Portal vein is patent on color Doppler imaging with normal direction of blood flow towards the liver; IMPRESSION:  Mildly dilated CBD which is usually normal after cholecystectomy. 2. Unremarkable appearance of the liver. ? ? ?HISTORY OF PRESENTING ILLNESS: In a wheelchair because of obesity.  Accompanied by family-son. ?Ashley Alvarado 71 y.o.  female pleasant patient with severe B12 deficiency and  thrombocytopenia/anemia. ? ?Patient is currently getting monthly B12 injections.  Feels better.  Denies any blood in stools or black or stools.  No nausea or vomiting. ?  ?Review of Systems  ?Constitutional:  Positive for malaise/fatigue. Negative for chills, diaphoresis, fever and weight loss.  ?HENT:  Negative for nosebleeds and sore throat.   ?Eyes:  Negative for double vision.  ?Respiratory:  Negative for cough, hemoptysis, sputum production, shortness of breath and wheezing.   ?Cardiovascular:  Negative for chest pain, palpitations, orthopnea and leg swelling.  ?Gastrointestinal:  Negative for abdominal pain, blood in stool, constipation, diarrhea, heartburn, melena, nausea and vomiting.  ?Genitourinary:  Negative for dysuria, frequency and urgency.  ?Musculoskeletal:  Positive for back pain and joint pain.  ?Skin: Negative.  Negative for itching and  rash.  ?Neurological:  Negative for dizziness, tingling, focal weakness, weakness and headaches.  ?Endo/Heme/Allergies:  Does not bruise/bleed easily.  ?Psychiatric/Behavioral:  Negative for depression. The patient is not nervous/anxious and does not have insomnia.   ? ? ?MEDICAL HISTORY:  ?Past Medical History:  ?Diagnosis Date  ? Anemia   ? Complication of anesthesia   ? Glaucoma   ? Hypertension   ? Osteoarthritis   ? Pituitary adenoma ([place])   ? Pre-diabetes   ? ? ?SURGICAL HISTORY: ?Past Surgical History:  ?Procedure Laterality Date  ? ABDOMINAL HYSTERECTOMY    ? BIOPSY  07/23/2021  ? Procedure: BIOPSY;  Surgeon: Ashley Alvarado., MD;  Location: [place];  Service: Gastroenterology;;  ? BRAIN SURGERY  08/2005  ? front meningioma  ? CATARACT EXTRACTION W/PHACO Right 09/08/2021  ? Procedure: CATARACT EXTRACTION PHACO AND INTRAOCULAR LENS PLACEMENT ([place]) RIGHT VISION BLUE 6.61 00:55.1;  Surgeon: Ashley Robson, MD;  Location: [place];  Service: Ophthalmology;  Laterality: Right;  ? CHOLECYSTECTOMY    ? COLONOSCOPY N/A 02/18/2021  ? Procedure: COLONOSCOPY;  Surgeon: Ashley Bellows, MD;  Location: [facility] ENDOSCOPY;  Service: Gastroenterology;  Laterality: N/A;  ? ESOPHAGOGASTRODUODENOSCOPY (EGD) WITH PROPOFOL N/A 02/18/2021  ? Procedure: ESOPHAGOGASTRODUODENOSCOPY (EGD) WITH PROPOFOL;  Surgeon: Ashley Bellows, MD;  Location: [facility] ENDOSCOPY;  Service: Gastroenterology;  Laterality: N/A;  ? ESOPHAGOGASTRODUODENOSCOPY (EGD) WITH PROPOFOL N/A 07/23/2021  ? Procedure: ESOPHAGOGASTRODUODENOSCOPY (EGD) WITH PROPOFOL;  Surgeon: Ashley Alvarado., MD;  Location: [place];  Service: Gastroenterology;  Laterality: N/A;  ? EUS N/A 07/23/2021  ? Procedure: UPPER ENDOSCOPIC ULTRASOUND (EUS) RADIAL;  Surgeon: Ashley Alvarado., MD;  Location: [place];  Service: Gastroenterology;  Laterality: N/A;  ? PITUITARY SURGERY  11/2005  ? PITUITARY SURGERY  02/2016  ? ? ?SOCIAL HISTORY: ?Social History   ? ?Socioeconomic History  ? Marital status: Divorced  ?  Spouse name: Not on file  ? Number of children: Not on file  ? Years of education: Not on file  ? Highest education level: Not on file  ?Occupational History  ? Not on file  ?Tobacco Use  ? Smoking status: Never  ? Smokeless tobacco: Never  ?Substance and Sexual Activity  ? Alcohol use: No  ?  Alcohol/week: 0.0 standard drinks  ? Drug use: No  ? Sexual activity: Not on file  ?Other Topics Concern  ? Not on file  ?Social History Narrative  ? Lives in [place] with son Ashley Alvarado with mom]. Never smoked; no alcohol. Worked in [employer]. Uses walker because of knee pain.   ? ?Social Determinants of Health  ? ?Financial Resource Strain: Not on file  ?Food Insecurity: Not on file  ?Transportation Needs: Not on file  ?Physical Activity: Not on file  ?Stress: Not on file  ?Social Connections: Not on file  ?Intimate Partner Violence: Not on file  ? ? ?FAMILY HISTORY: ?Family History  ?Problem Relation Age of Onset  ? Hypertension Father   ? Diabetes Mother   ? Hypertension Mother   ? Kidney disease Mother   ? Breast cancer Neg Hx   ? ? ?ALLERGIES:  has No Known Allergies. ? ?MEDICATIONS:  ?Current Outpatient Medications  ?Medication Sig Dispense Refill  ? albuterol (VENTOLIN HFA) 108 (90 Base) MCG/ACT inhaler Inhale 2 puffs into the lungs every 6 (six) hours as needed for shortness of breath.    ? amLODipine (NORVASC) 10 MG tablet Hold until followup with your PCP because your blood pressure was normal without any BP medications. (Patient taking differently: Take 10 mg by mouth daily.)    ? aspirin EC 81 MG tablet Take 81 mg by mouth daily.    ? baclofen (LIORESAL) 10 MG tablet Take 10 mg by mouth daily as needed for muscle spasms.    ? celecoxib (CELEBREX) 100 MG capsule Take 100 mg by mouth 2 (two) times daily.    ? cetirizine (ZYRTEC) 10 MG tablet Take 10 mg by mouth daily as needed for allergies or rhinitis.    ? cyanocobalamin (,VITAMIN B-12,) 1000 MCG/ML  injection Inject 1,000 mcg into the muscle every 30 (thirty) days.    ? hydrochlorothiazide (HYDRODIURIL) 25 MG tablet Hold until followup with your PCP because your blood pressure was normal without any BP medications. (Patient taking differently: Take 25 mg by mouth daily.)    ? latanoprost (XALATAN) 0.005 % ophthalmic solution Place 1 drop into both eyes at bedtime.    ? losartan (COZAAR) 100 MG tablet Hold until followup with your PCP because your blood pressure was normal without any BP medications. (Patient taking differently: Take 100 mg by mouth daily.)    ? Menthol-Camphor (ICY HOT ADVANCED PAIN RELIEF) 16-11 % CREA Apply 1 application topically daily as needed (knee pain).    ? Omega-3 1000 MG CAPS Take 1,000 mg by mouth in the morning and at bedtime.    ? pantoprazole (PROTONIX) 40 MG tablet Take 40 mg by mouth daily.    ? sodium chloride (OCEAN) 0.65 % SOLN nasal spray Place 1 spray into both nostrils as needed for congestion.    ? Vitamin D, Ergocalciferol, (DRISDOL) 1.25 MG (50000 UNIT) CAPS capsule Take 50,000 Units by mouth  every Thursday.    ? ondansetron (ZOFRAN) 4 MG tablet Take 4 mg by mouth every 8 (eight) hours as needed for nausea. (Patient not taking: Reported on 08/24/2021)    ? ?No current facility-administered medications for this visit.  ? ?  ?. ? ?PHYSICAL EXAMINATION: ? ? ?Vitals:  ? 11/24/21 0953  ?BP: (!) 146/100  ?Pulse: 87  ?Temp: (!) 97.2 ?F (36.2 ?C)  ?SpO2: 97%  ? ?Filed Weights  ? 11/24/21 0953  ?Weight: (!) 311 lb 6.4 oz (141.3 kg)  ? ? ?Physical Exam ?Vitals and nursing note reviewed.  ?HENT:  ?   Head: Normocephalic and atraumatic.  ?   Mouth/Throat:  ?   Pharynx: Oropharynx is clear.  ?Eyes:  ?   Extraocular Movements: Extraocular movements intact.  ?   Pupils: Pupils are equal, round, and reactive to light.  ?Cardiovascular:  ?   Rate and Rhythm: Normal rate and regular rhythm.  ?Pulmonary:  ?   Comments: Decreased breath sounds bilaterally.  ?Abdominal:  ?   Palpations:  Abdomen is soft.  ?Musculoskeletal:     ?   General: Normal range of motion.  ?   Cervical back: Normal range of motion.  ?Skin: ?   General: Skin is warm.  ?Neurological:  ?   General: No focal deficit present.

## 2021-11-24 NOTE — Patient Instructions (Signed)
#  Recommend gentle iron 1 pill a day; should not upset your stomach or cause constipation.  Talk to the pharmacist if you can find it/it is over-the-counter.  

## 2021-11-24 NOTE — Assessment & Plan Note (Signed)
#  Anemia/thrombocytopenia-severe B12 deficiency-July 2022 B12 less than 50.  GI work-up negative for any bleeding. POSITIVE for  antiparietal antibodies; methylmalonic acid-WNL  ? ?#Severe B12 deficiency [July, 2022]-B12 < 50.  Continue B12 on a monthly basis. ? ?# Mild microcytosis- awaiting iron studies; proceed with gentle iron.  ? ?# Morbid obesity-Walker/ bilateral Knee pain- off meloxicam [sec to gastritis; per pt]- on tylenol- ? ?# Elevated BP- reocmmend checking BO at home.  ? ?# DISPOSITION: ?# b12 today ?# b12 injection- monthly x6  ?# follow up in 6 month- MD; labs- cbcbmp;B12 levels; iron studies/ferritin- and B12 injection-dr.B ? ? ? ?

## 2021-12-24 ENCOUNTER — Encounter: Payer: Self-pay | Admitting: Gastroenterology

## 2021-12-24 ENCOUNTER — Ambulatory Visit: Payer: Medicare HMO | Admitting: Gastroenterology

## 2021-12-24 VITALS — BP 173/99 | HR 101 | Temp 98.3°F | Ht 65.0 in | Wt 310.4 lb

## 2021-12-24 DIAGNOSIS — D510 Vitamin B12 deficiency anemia due to intrinsic factor deficiency: Secondary | ICD-10-CM

## 2021-12-24 DIAGNOSIS — D179 Benign lipomatous neoplasm, unspecified: Secondary | ICD-10-CM

## 2021-12-24 NOTE — Progress Notes (Signed)
Jonathon Bellows MD, MRCP(U.K) [address]  [place]  [place], [place] [address]  Main: [phone]  Fax: [phone]   Primary Care Physician: Frazier Richards, MD  Primary Gastroenterologist:  Dr. Jonathon Bellows   Chief Complaint  Patient presents with   Anemia    HPI: Ashley Alvarado is a 71 y.o. female   Summary of history :   She was seen by me when admitted on 02/15/2021 for anemia.  Chronic NSAID use.  She presented with weakness palpitations and bright red blood per rectum with associated constipation for over a month.  Hemoglobin was 6.3 g.  No evidence of iron deficiency.  LFTs were noted to be abnormal.  I performed an EGD and colonoscopy on 02/18/2021 and a 40 mm pedunculated submucosal lesion was found on the greater curvature of the stomach, Plan to refer for EUS.  Diffuse atrophic mucosa seen in the remaining part of the stomach.  Colonoscopy showed diverticulosis of the colon.  A 6 mm polyp in the distal rectum was resected.  Biopsies of the stomach showed no abnormalities.  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  Hemoglobin at discharge was 7.8 g, B12 was less than 50 folate was normal.  AST 109 ALT of 48 total bilirubin 1.7 predominantly indirect.  Right upper quadrant ultrasound showed unremarkable appearance of the liver 05/26/2021: Hemoglobin 12.6 g and MCV of 73.9 03/26/2021: Liver work-up negative since iron studies are normal but had a low platelet count referred to hematology to evaluate further.  B12 level normalized at 615   05/26/2021 seen Dr. Rogue Bussing for severe anemia and low B12.  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  11/24/2021: HB 13.5 grams B12 84  She is  doing well no new complaints.  No new concerns   Current Outpatient Medications  Medication Sig Dispense Refill   albuterol (VENTOLIN HFA) 108 (90 Base) MCG/ACT inhaler Inhale 2 puffs into the lungs every 6 (six) hours as needed for shortness of breath.     amLODipine (NORVASC) 10 MG tablet Hold until followup with your PCP because your blood pressure was normal without any BP medications. (Patient taking differently: Take 10 mg by mouth daily.)     aspirin EC 81 MG tablet Take 81 mg by mouth daily.     baclofen (LIORESAL) 10 MG tablet Take 10 mg by mouth daily as needed for muscle spasms.     celecoxib (CELEBREX) 100 MG capsule Take 100 mg by mouth 2 (two) times daily.     cetirizine (ZYRTEC) 10 MG tablet Take 10 mg by mouth daily as needed for allergies or rhinitis.     cyanocobalamin (,VITAMIN B-12,) 1000 MCG/ML injection Inject 1,000 mcg into the muscle every 30 (thirty) days.     hydrochlorothiazide (HYDRODIURIL) 25 MG tablet Hold until followup with your PCP because your blood pressure was normal without any BP medications. (Patient taking differently: Take 25 mg by mouth daily.)     latanoprost (XALATAN) 0.005 % ophthalmic solution Place 1 drop into both eyes at bedtime.     losartan (COZAAR) 100 MG tablet Hold until followup with your PCP because your blood pressure was normal without any BP medications. (Patient taking differently: Take 100  mg by mouth daily.)     Menthol-Camphor (ICY HOT ADVANCED PAIN RELIEF) 16-11 % CREA Apply 1 application topically daily as needed (knee pain).     Omega-3 1000 MG CAPS Take 1,000 mg by mouth in the morning and at bedtime.     ondansetron (ZOFRAN) 4 MG tablet Take 4 mg by mouth every 8 (eight) hours as needed for nausea.     pantoprazole (PROTONIX) 40 MG tablet Take 40 mg by mouth daily.     sodium chloride (OCEAN) 0.65 % SOLN nasal spray Place 1 spray into both nostrils as needed for congestion.     Vitamin D, Ergocalciferol, (DRISDOL) 1.25 MG (50000  UNIT) CAPS capsule Take 50,000 Units by mouth every Thursday.     No current facility-administered medications for this visit.    Allergies as of 12/24/2021   (No Known Allergies)    ROS:  General: Negative for anorexia, weight loss, fever, chills, fatigue, weakness. ENT: Negative for hoarseness, difficulty swallowing , nasal congestion. CV: Negative for chest pain, angina, palpitations, dyspnea on exertion, peripheral edema.  Respiratory: Negative for dyspnea at rest, dyspnea on exertion, cough, sputum, wheezing.  GI: See history of present illness. GU:  Negative for dysuria, hematuria, urinary incontinence, urinary frequency, nocturnal urination.  Endo: Negative for unusual weight change.    Physical Examination:   BP (!) 173/99   Pulse (!) 101   Temp 98.3 F (36.8 C) (Oral)   Ht '5\' 5"'$  (1.651 m)   Wt (!) 310 lb 6.4 oz (140.8 kg)   BMI 51.65 kg/m   General: Well-nourished, well-developed in no acute distress.  Eyes: No icterus. Conjunctivae pink. Neuro: Alert and oriented x 3.  Grossly intact. Skin: Warm and dry, no jaundice.   Psych: Alert and cooperative, normal mood and affect.   Imaging Studies: No results found.  Assessment and Plan:   Ashley Alvarado is a 71 y.o. y/o female here today to follow-up for anemia.  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  Iron studies are normal.  Hemoglobin is improved with B12 supplementation 12.6 g following up with hematology.B12 still low. EUS showed gastric lesion seen on prior EGD is lipoma and no further evaluation needed for now.      Dr Jonathon Bellows  MD,MRCP [facility]) Follow up in as needed

## 2021-12-28 ENCOUNTER — Inpatient Hospital Stay: Payer: Medicare HMO | Attending: Internal Medicine

## 2021-12-28 DIAGNOSIS — E538 Deficiency of other specified B group vitamins: Secondary | ICD-10-CM | POA: Insufficient documentation

## 2021-12-28 MED ORDER — CYANOCOBALAMIN 1000 MCG/ML IJ SOLN
1000.0000 ug | Freq: Once | INTRAMUSCULAR | Status: AC
  Administered 2021-12-28: 1000 ug via INTRAMUSCULAR

## 2022-01-25 ENCOUNTER — Inpatient Hospital Stay: Payer: Medicare HMO | Attending: Internal Medicine

## 2022-01-25 DIAGNOSIS — Z79899 Other long term (current) drug therapy: Secondary | ICD-10-CM | POA: Insufficient documentation

## 2022-01-25 DIAGNOSIS — E538 Deficiency of other specified B group vitamins: Secondary | ICD-10-CM | POA: Insufficient documentation

## 2022-01-25 MED ORDER — CYANOCOBALAMIN 1000 MCG/ML IJ SOLN
1000.0000 ug | Freq: Once | INTRAMUSCULAR | Status: AC
  Administered 2022-01-25: 1000 ug via INTRAMUSCULAR
  Filled 2022-01-25: qty 1

## 2022-03-01 ENCOUNTER — Inpatient Hospital Stay: Payer: Medicare HMO | Attending: Internal Medicine

## 2022-03-01 DIAGNOSIS — E538 Deficiency of other specified B group vitamins: Secondary | ICD-10-CM | POA: Insufficient documentation

## 2022-03-01 MED ORDER — CYANOCOBALAMIN 1000 MCG/ML IJ SOLN
1000.0000 ug | Freq: Once | INTRAMUSCULAR | Status: AC
  Administered 2022-03-01: 1000 ug via INTRAMUSCULAR
  Filled 2022-03-01: qty 1

## 2022-03-30 ENCOUNTER — Inpatient Hospital Stay: Payer: Medicare HMO | Attending: Internal Medicine

## 2022-03-30 DIAGNOSIS — E538 Deficiency of other specified B group vitamins: Secondary | ICD-10-CM | POA: Diagnosis present

## 2022-03-30 MED ORDER — CYANOCOBALAMIN 1000 MCG/ML IJ SOLN
1000.0000 ug | Freq: Once | INTRAMUSCULAR | Status: AC
  Administered 2022-03-30: 1000 ug via INTRAMUSCULAR
  Filled 2022-03-30: qty 1

## 2022-05-03 ENCOUNTER — Inpatient Hospital Stay: Payer: Medicare HMO

## 2022-05-06 ENCOUNTER — Inpatient Hospital Stay: Payer: Medicare HMO

## 2022-05-07 ENCOUNTER — Inpatient Hospital Stay: Payer: Medicare HMO | Attending: Internal Medicine

## 2022-05-07 DIAGNOSIS — E538 Deficiency of other specified B group vitamins: Secondary | ICD-10-CM | POA: Diagnosis present

## 2022-05-07 MED ORDER — CYANOCOBALAMIN 1000 MCG/ML IJ SOLN
1000.0000 ug | Freq: Once | INTRAMUSCULAR | Status: AC
  Administered 2022-05-07: 1000 ug via INTRAMUSCULAR
  Filled 2022-05-07: qty 1

## 2022-05-27 ENCOUNTER — Encounter: Payer: Self-pay | Admitting: Internal Medicine

## 2022-05-27 ENCOUNTER — Inpatient Hospital Stay (HOSPITAL_BASED_OUTPATIENT_CLINIC_OR_DEPARTMENT_OTHER): Payer: Medicare HMO | Admitting: Internal Medicine

## 2022-05-27 ENCOUNTER — Inpatient Hospital Stay: Payer: Medicare HMO

## 2022-05-27 ENCOUNTER — Inpatient Hospital Stay: Payer: Medicare HMO | Attending: Internal Medicine

## 2022-05-27 VITALS — BP 157/87 | HR 94 | Temp 96.6°F | Resp 20 | Wt 323.3 lb

## 2022-05-27 DIAGNOSIS — D510 Vitamin B12 deficiency anemia due to intrinsic factor deficiency: Secondary | ICD-10-CM | POA: Diagnosis present

## 2022-05-27 DIAGNOSIS — E538 Deficiency of other specified B group vitamins: Secondary | ICD-10-CM

## 2022-05-27 LAB — CBC WITH DIFFERENTIAL/PLATELET
Abs Immature Granulocytes: 0.02 10*3/uL (ref 0.00–0.07)
Basophils Absolute: 0 10*3/uL (ref 0.0–0.1)
Basophils Relative: 1 %
Eosinophils Absolute: 0.3 10*3/uL (ref 0.0–0.5)
Eosinophils Relative: 4 %
HCT: 43.4 % (ref 36.0–46.0)
Hemoglobin: 13.9 g/dL (ref 12.0–15.0)
Immature Granulocytes: 0 %
Lymphocytes Relative: 22 %
Lymphs Abs: 1.4 10*3/uL (ref 0.7–4.0)
MCH: 26 pg (ref 26.0–34.0)
MCHC: 32 g/dL (ref 30.0–36.0)
MCV: 81.1 fL (ref 80.0–100.0)
Monocytes Absolute: 0.5 10*3/uL (ref 0.1–1.0)
Monocytes Relative: 7 %
Neutro Abs: 4.2 10*3/uL (ref 1.7–7.7)
Neutrophils Relative %: 66 %
Platelets: 166 10*3/uL (ref 150–400)
RBC: 5.35 MIL/uL — ABNORMAL HIGH (ref 3.87–5.11)
RDW: 16.8 % — ABNORMAL HIGH (ref 11.5–15.5)
WBC: 6.4 10*3/uL (ref 4.0–10.5)
nRBC: 0 % (ref 0.0–0.2)

## 2022-05-27 LAB — BASIC METABOLIC PANEL
Anion gap: 8 (ref 5–15)
BUN: 16 mg/dL (ref 8–23)
CO2: 30 mmol/L (ref 22–32)
Calcium: 9.5 mg/dL (ref 8.9–10.3)
Chloride: 102 mmol/L (ref 98–111)
Creatinine, Ser: 0.63 mg/dL (ref 0.44–1.00)
GFR, Estimated: >60 mL/min (ref >60)
Glucose, Bld: 145 mg/dL — ABNORMAL HIGH (ref 70–99)
Potassium: 4 mmol/L (ref 3.5–5.1)
Sodium: 140 mmol/L (ref 135–145)

## 2022-05-27 LAB — IRON AND TIBC
Iron: 75 ug/dL (ref 28–170)
Saturation Ratios: 25 % (ref 10.4–31.8)
TIBC: 304 ug/dL (ref 250–450)
UIBC: 229 ug/dL

## 2022-05-27 LAB — VITAMIN B12: Vitamin B-12: 156 pg/mL — ABNORMAL LOW (ref 180–914)

## 2022-05-27 LAB — FERRITIN: Ferritin: 129 ng/mL (ref 11–307)

## 2022-05-27 MED ORDER — CYANOCOBALAMIN 1000 MCG/ML IJ SOLN
1000.0000 ug | Freq: Once | INTRAMUSCULAR | Status: AC
  Administered 2022-05-27: 1000 ug via INTRAMUSCULAR
  Filled 2022-05-27: qty 1

## 2022-05-27 NOTE — Progress Notes (Signed)
[facility] NOTE  Patient Care Team: Frazier Richards, MD as PCP - General (Family Medicine)  CHIEF COMPLAINTS/PURPOSE OF CONSULTATION: Thrombocytopenia/ANEMIA   HEMATOLOGY HISTORY  # July 2022- ANEMIA-THROMBOCYTOPENIA [platelets- 70-80s; WBC- ; Hb-7-8; dmitted to hospital for severe anemia-hemoglobin around 6.3.  Patient is s/p units of PRBC transfusion.  Patient noted to have B12 deficiency less than 50.   Vitamin B-12 232 - 1,245 pg/mL 615  <50 Low  R, CM     # Pituitary adenoma- s/p surgery- s/p RT- 5 weeks [[facility]; last RT in Feb 2022]  July 2022- No focal lesion identified. Within normal limits in parenchymal  echogenicity. Portal vein is patent on color Doppler imaging with normal direction of blood flow towards the liver; IMPRESSION:  Mildly dilated CBD which is usually normal after cholecystectomy. 2. Unremarkable appearance of the liver.   HISTORY OF PRESENTING ILLNESS: In a rolling walker; because of obesity.  Alone.   Ashley Alvarado 71 y.o.  female pleasant patient with pernicious anemia [severe B12 deficiency and  thrombocytopenia/anemia] is here for a follow up.   Patient is currently getting monthly B12 injections.  Feels better.  Denies any blood in stools or black or stools.  No nausea or vomiting.   Review of Systems  Constitutional:  Positive for malaise/fatigue. Negative for chills, diaphoresis, fever and weight loss.  HENT:  Negative for nosebleeds and sore throat.   Eyes:  Negative for double vision.  Respiratory:  Negative for cough, hemoptysis, sputum production, shortness of breath and wheezing.   Cardiovascular:  Negative for chest pain, palpitations, orthopnea and leg swelling.  Gastrointestinal:  Negative for abdominal pain, blood in stool, constipation, diarrhea, heartburn, melena, nausea and vomiting.  Genitourinary:  Negative for dysuria, frequency and urgency.  Musculoskeletal:  Positive for back pain and joint pain.  Skin:  Negative.  Negative for itching and rash.  Neurological:  Negative for dizziness, tingling, focal weakness, weakness and headaches.  Endo/Heme/Allergies:  Does not bruise/bleed easily.  Psychiatric/Behavioral:  Negative for depression. The patient is not nervous/anxious and does not have insomnia.      MEDICAL HISTORY:  Past Medical History:  Diagnosis Date   Anemia    Complication of anesthesia    Glaucoma    Hypertension    Osteoarthritis    Pituitary adenoma ([place])    Pre-diabetes     SURGICAL HISTORY: Past Surgical History:  Procedure Laterality Date   ABDOMINAL HYSTERECTOMY     BIOPSY  07/23/2021   Procedure: BIOPSY;  Surgeon: Rush Landmark Telford Nab., MD;  Location: [facility];  Service: Gastroenterology;;   BRAIN SURGERY  08/2005   front meningioma   CATARACT EXTRACTION W/PHACO Right 09/08/2021   Procedure: CATARACT EXTRACTION PHACO AND INTRAOCULAR LENS PLACEMENT ([place]) RIGHT VISION BLUE 6.61 00:55.1;  Surgeon: Birder Robson, MD;  Location: [facility];  Service: Ophthalmology;  Laterality: Right;   CHOLECYSTECTOMY     COLONOSCOPY N/A 02/18/2021   Procedure: COLONOSCOPY;  Surgeon: Jonathon Bellows, MD;  Location: [facility] ENDOSCOPY;  Service: Gastroenterology;  Laterality: N/A;   ESOPHAGOGASTRODUODENOSCOPY (EGD) WITH PROPOFOL N/A 02/18/2021   Procedure: ESOPHAGOGASTRODUODENOSCOPY (EGD) WITH PROPOFOL;  Surgeon: Jonathon Bellows, MD;  Location[facility] END[facility]COPY;  Service: Gastroenterology;  Laterality: N/A;   ESOPHAGOGASTRODUODENOSCOPY (EGD) WITH PROPOFOL N/A 07/23/2021   Procedure: ESOPHAGOGASTRODUODENOSCOPY (EGD) WITH PROPOFOL;  Surgeon: Rush Landmark Telford Nab., MD;  Location: [facility];  Service: Gastroenterology;  Laterality: N/A;   EUS N/A 07/23/2021   Procedure: UPPER ENDOSCOPIC ULTRASOUND (EUS) RADIAL;  Surgeon:  Mansouraty, Telford Nab., MD;  Location: [facility];  Service: Gastroenterology;  Laterality: N/A;   PITUITARY SURGERY  11/2005   PITUITARY SURGERY  02/2016     SOCIAL HISTORY: Social History   Socioeconomic History   Marital status: Divorced    Spouse name: Not on file   Number of children: Not on file   Years of education: Not on file   Highest education level: Not on file  Occupational History   Not on file  Tobacco Use   Smoking status: Never   Smokeless tobacco: Never  Substance and Sexual Activity   Alcohol use: No    Alcohol/week: 0.0 standard drinks of alcohol   Drug use: No   Sexual activity: Not on file  Other Topics Concern   Not on file  Social History Narrative   Lives in [place] with [place] with mom]. Never smoked; no alcohol. Worked in [employer]. Uses walker because of knee pain.    Social Determinants of Health   Financial Resource Strain: Not on file  Food Insecurity: Not on file  Transportation Needs: Not on file  Physical Activity: Not on file  Stress: Not on file  Social Connections: Not on file  Intimate Partner Violence: Not on file    FAMILY HISTORY: Family History  Problem Relation Age of Onset   Hypertension Father    Diabetes Mother    Hypertension Mother    Kidney disease Mother    Breast cancer Neg Hx     ALLERGIES:  has No Known Allergies.  MEDICATIONS:  Current Outpatient Medications  Medication Sig Dispense Refill   albuterol (VENTOLIN HFA) 108 (90 Base) MCG/ACT inhaler Inhale 2 puffs into the lungs every 6 (six) hours as needed for shortness of breath.     amLODipine (NORVASC) 10 MG tablet Hold until followup with your PCP because your blood pressure was normal without any BP medications. (Patient taking differently: Take 10 mg by mouth daily.)     aspirin EC 81 MG tablet Take 81 mg by mouth daily.     baclofen (LIORESAL) 10 MG tablet Take 10 mg by mouth daily as needed for muscle spasms.     celecoxib (CELEBREX) 100 MG capsule Take 100 mg by mouth 2 (two) times daily.     cetirizine (ZYRTEC) 10 MG tablet Take 10 mg by mouth daily as needed for allergies or rhinitis.      cyanocobalamin (,VITAMIN B-12,) 1000 MCG/ML injection Inject 1,000 mcg into the muscle every 30 (thirty) days.     hydrochlorothiazide (HYDRODIURIL) 25 MG tablet Hold until followup with your PCP because your blood pressure was normal without any BP medications. (Patient taking differently: Take 25 mg by mouth daily.)     latanoprost (XALATAN) 0.005 % ophthalmic solution Place 1 drop into both eyes at bedtime.     losartan (COZAAR) 100 MG tablet Hold until followup with your PCP because your blood pressure was normal without any BP medications. (Patient taking differently: Take 100 mg by mouth daily.)     Menthol-Camphor (ICY HOT ADVANCED PAIN RELIEF) 16-11 % CREA Apply 1 application topically daily as needed (knee pain).     Omega-3 1000 MG CAPS Take 1,000 mg by mouth in the morning and at bedtime.     pantoprazole (PROTONIX) 40 MG tablet Take 40 mg by mouth daily.     Vitamin D, Ergocalciferol, (DRISDOL) 1.25 MG (50000 UNIT) CAPS capsule Take 50,000 Units by mouth every Thursday.     ondansetron (ZOFRAN)  4 MG tablet Take 4 mg by mouth every 8 (eight) hours as needed for nausea. (Patient not taking: Reported on 05/27/2022)     sodium chloride (OCEAN) 0.65 % SOLN nasal spray Place 1 spray into both nostrils as needed for congestion. (Patient not taking: Reported on 05/27/2022)     No current facility-administered medications for this visit.     Marland Kitchen  PHYSICAL EXAMINATION:   Vitals:   05/27/22 1113  BP: (!) 157/87  Pulse: 94  Resp: 20  Temp: (!) 96.6 F (35.9 C)  SpO2: 100%   Filed Weights   05/27/22 1113  Weight: (!) 323 lb 4.8 oz (146.6 kg)    Physical Exam Vitals and nursing note reviewed.  HENT:     Head: Normocephalic and atraumatic.     Mouth/Throat:     Pharynx: Oropharynx is clear.  Eyes:     Extraocular Movements: Extraocular movements intact.     Pupils: Pupils are equal, round, and reactive to light.  Cardiovascular:     Rate and Rhythm: Normal rate and regular rhythm.   Pulmonary:     Comments: Decreased breath sounds bilaterally.  Abdominal:     Palpations: Abdomen is soft.  Musculoskeletal:        General: Normal range of motion.     Cervical back: Normal range of motion.  Skin:    General: Skin is warm.  Neurological:     General: No focal deficit present.     Mental Status: She is alert and oriented to person, place, and time.  Psychiatric:        Behavior: Behavior normal.        Judgment: Judgment normal.      LABORATORY DATA:  I have reviewed the data as listed Lab Results  Component Value Date   WBC 6.4 05/27/2022   HGB 13.9 05/27/2022   HCT 43.4 05/27/2022   MCV 81.1 05/27/2022   PLT 166 05/27/2022   Recent Labs    11/24/21 0951 05/27/22 1027  NA 135 140  K 3.4* 4.0  CL 100 102  CO2 29 30  GLUCOSE 133* 145*  BUN 17 16  CREATININE 0.59 0.63  CALCIUM 9.2 9.5  GFRNONAA >60 >60     No results found.  ASSESSMENT & PLAN:   B12 deficiency #Pernicious anemia -anemia/thrombocytopenia-severe B12 deficiency-July 2022 B12 less than 50.  GI work-up negative for any bleeding. POSITIVE for  antiparietal antibodies; methylmalonic acid-WNL   # Severe B12 deficiency [July, 2022]-B12 < 50.  Continue B12 on a monthly basis.  # Morbid obesity-Walker/ bilateral Knee pain- off meloxicam [sec to gastritis; per pt]- on tylenol-  Discussed importance of healthy weight/and weight loss.  Strongly recommend eating more green leafy vegetables and cutting down processed food/ carbohydrates.  Instead increasing whole grains / protein in the diet.  Multiple studies have shown that optimal weight would help improve cardiovascular risk; also shown to cut on the risk of malignancies-colon cancer, breast cancer ovarian/uterine cancer in women and also prostate cancer in men.  Defer to PCP regarding evaluation with obesity medicine physician.  # DISPOSITION: # b12 today # b12 injection- monthly x6  # follow up in 6 month- MD; labs- cbcbmp;B12 levels;  iron studies/ferritin- and B12 injection-dr.B     All questions were answered. The patient knows to call the clinic with any problems, questions or concerns.   Cammie Sickle, MD 05/27/2022 12:02 PM

## 2022-05-27 NOTE — Assessment & Plan Note (Addendum)
#  Pernicious anemia -anemia/thrombocytopenia-severe B12 deficiency-July 2022 B12 less than 50.  GI work-up negative for any bleeding. POSITIVE for  antiparietal antibodies; methylmalonic acid-WNL   # Severe B12 deficiency [July, 2022]-B12 < 50.  Continue B12 on a monthly basis.  # Morbid obesity-Walker/ bilateral Knee pain- off meloxicam [sec to gastritis; per pt]- on tylenol-  Discussed importance of healthy weight/and weight loss.  Strongly recommend eating more green leafy vegetables and cutting down processed food/ carbohydrates.  Instead increasing whole grains / protein in the diet.  Multiple studies have shown that optimal weight would help improve cardiovascular risk; also shown to cut on the risk of malignancies-colon cancer, breast cancer ovarian/uterine cancer in women and also prostate cancer in men.  Defer to PCP regarding evaluation with obesity medicine physician.  # DISPOSITION: # b12 today # b12 injection- monthly x6  # follow up in 6 month- MD; labs- cbcbmp;B12 levels; iron studies/ferritin- and B12 injection-dr.B

## 2022-05-27 NOTE — Progress Notes (Signed)
Patient has no concerns at the moment. 

## 2022-06-28 ENCOUNTER — Inpatient Hospital Stay: Payer: Medicare HMO | Attending: Internal Medicine

## 2022-07-27 ENCOUNTER — Inpatient Hospital Stay: Payer: Medicare HMO | Attending: Internal Medicine

## 2022-08-19 ENCOUNTER — Encounter: Payer: Self-pay | Admitting: Internal Medicine

## 2022-08-20 ENCOUNTER — Encounter: Payer: Self-pay | Admitting: Internal Medicine

## 2022-08-27 ENCOUNTER — Inpatient Hospital Stay: Payer: 59 | Attending: Internal Medicine

## 2022-08-30 ENCOUNTER — Other Ambulatory Visit: Payer: Self-pay | Admitting: Family Medicine

## 2022-08-30 DIAGNOSIS — Z1231 Encounter for screening mammogram for malignant neoplasm of breast: Secondary | ICD-10-CM

## 2022-09-22 ENCOUNTER — Encounter: Payer: Self-pay | Admitting: Internal Medicine

## 2022-09-23 ENCOUNTER — Ambulatory Visit: Payer: 59

## 2022-09-27 ENCOUNTER — Inpatient Hospital Stay: Payer: 59 | Attending: Internal Medicine

## 2022-10-26 ENCOUNTER — Inpatient Hospital Stay: Payer: 59 | Attending: Internal Medicine

## 2022-11-25 ENCOUNTER — Inpatient Hospital Stay: Payer: 59

## 2022-11-25 ENCOUNTER — Inpatient Hospital Stay: Payer: 59 | Admitting: Internal Medicine

## 2022-11-26 ENCOUNTER — Inpatient Hospital Stay: Payer: 59

## 2022-11-26 ENCOUNTER — Inpatient Hospital Stay: Payer: 59 | Admitting: Internal Medicine

## 2022-12-07 ENCOUNTER — Encounter: Payer: Self-pay | Admitting: Internal Medicine

## 2022-12-07 ENCOUNTER — Inpatient Hospital Stay (HOSPITAL_BASED_OUTPATIENT_CLINIC_OR_DEPARTMENT_OTHER): Payer: 59 | Admitting: Internal Medicine

## 2022-12-07 ENCOUNTER — Inpatient Hospital Stay: Payer: 59

## 2022-12-07 ENCOUNTER — Inpatient Hospital Stay: Payer: 59 | Attending: Internal Medicine

## 2022-12-07 ENCOUNTER — Ambulatory Visit
Admission: RE | Admit: 2022-12-07 | Discharge: 2022-12-07 | Disposition: A | Discharge status: Home / Self Care | Payer: 59 | Source: Ambulatory Visit | Attending: Family Medicine | Admitting: Family Medicine

## 2022-12-07 VITALS — BP 139/89 | HR 85 | Temp 96.8°F | Ht 65.0 in | Wt 332.4 lb

## 2022-12-07 DIAGNOSIS — Z1231 Encounter for screening mammogram for malignant neoplasm of breast: Secondary | ICD-10-CM

## 2022-12-07 DIAGNOSIS — Z8639 Personal history of other endocrine, nutritional and metabolic disease: Secondary | ICD-10-CM | POA: Diagnosis not present

## 2022-12-07 DIAGNOSIS — E538 Deficiency of other specified B group vitamins: Secondary | ICD-10-CM

## 2022-12-07 LAB — COMPREHENSIVE METABOLIC PANEL
ALT: 18 U/L (ref 0–44)
AST: 27 U/L (ref 15–41)
Albumin: 3.8 g/dL (ref 3.5–5.0)
Alkaline Phosphatase: 76 U/L (ref 38–126)
Anion gap: 7 (ref 5–15)
BUN: 17 mg/dL (ref 8–23)
CO2: 31 mmol/L (ref 22–32)
Calcium: 9.2 mg/dL (ref 8.9–10.3)
Chloride: 101 mmol/L (ref 98–111)
Creatinine, Ser: 0.78 mg/dL (ref 0.44–1.00)
GFR, Estimated: >60 mL/min (ref >60)
Glucose, Bld: 123 mg/dL — ABNORMAL HIGH (ref 70–99)
Potassium: 4 mmol/L (ref 3.5–5.1)
Sodium: 139 mmol/L (ref 135–145)
Total Bilirubin: 0.5 mg/dL (ref 0.3–1.2)
Total Protein: 7.3 g/dL (ref 6.5–8.1)

## 2022-12-07 LAB — IRON AND TIBC
Iron: 66 ug/dL (ref 28–170)
Saturation Ratios: 23 % (ref 10.4–31.8)
TIBC: 286 ug/dL (ref 250–450)
UIBC: 220 ug/dL

## 2022-12-07 LAB — CBC WITH DIFFERENTIAL/PLATELET
Abs Immature Granulocytes: 0.01 10*3/uL (ref 0.00–0.07)
Basophils Absolute: 0 10*3/uL (ref 0.0–0.1)
Basophils Relative: 1 %
Eosinophils Absolute: 0.3 10*3/uL (ref 0.0–0.5)
Eosinophils Relative: 4 %
HCT: 41.3 % (ref 36.0–46.0)
Hemoglobin: 13 g/dL (ref 12.0–15.0)
Immature Granulocytes: 0 %
Lymphocytes Relative: 24 %
Lymphs Abs: 1.6 10*3/uL (ref 0.7–4.0)
MCH: 25.8 pg — ABNORMAL LOW (ref 26.0–34.0)
MCHC: 31.5 g/dL (ref 30.0–36.0)
MCV: 82.1 fL (ref 80.0–100.0)
Monocytes Absolute: 0.6 10*3/uL (ref 0.1–1.0)
Monocytes Relative: 9 %
Neutro Abs: 4.3 10*3/uL (ref 1.7–7.7)
Neutrophils Relative %: 62 %
Platelets: 197 10*3/uL (ref 150–400)
RBC: 5.03 MIL/uL (ref 3.87–5.11)
RDW: 16.2 % — ABNORMAL HIGH (ref 11.5–15.5)
WBC: 6.8 10*3/uL (ref 4.0–10.5)
nRBC: 0 % (ref 0.0–0.2)

## 2022-12-07 LAB — FERRITIN: Ferritin: 114 ng/mL (ref 11–307)

## 2022-12-07 MED ORDER — CYANOCOBALAMIN 1000 MCG/ML IJ SOLN
1000.0000 ug | Freq: Once | INTRAMUSCULAR | Status: AC
  Administered 2022-12-07: 1000 ug via INTRAMUSCULAR

## 2022-12-07 NOTE — Progress Notes (Signed)
Can you refill her vit d if she still needs it?

## 2022-12-07 NOTE — Assessment & Plan Note (Addendum)
#  Pernicious anemia -anemia/thrombocytopenia-severe B12 deficiency-July 2022 B12 less than 50.  GI work-up negative for any bleeding. POSITIVE for  antiparietal antibodies; methylmalonic acid-WNL. Today Hb 13.0   # Severe B12 deficiency [July, 2022]-B12 < 50.  Continue B12 on a monthly basis.  # DISPOSITION: # b12 today # b12 injection- monthly x6  # follow up in 6 month- MD; labs- cbcbmp;B12 levels; iron studies/ferritin- and B12 injection-dr.B

## 2022-12-07 NOTE — Progress Notes (Signed)
[facility] CONSULT NOTE  Patient Care Team: Abram Sander, MD as PCP - General (Family Medicine)  CHIEF COMPLAINTS/PURPOSE OF CONSULTATION: Thrombocytopenia/ANEMIA   HEMATOLOGY HISTORY  # July 2022- ANEMIA-THROMBOCYTOPENIA [platelets- 70-80s; WBC- ; Hb-7-8; dmitted to hospital for severe anemia-hemoglobin around 6.3.  Patient is s/p units of PRBC transfusion.  Patient noted to have B12 deficiency less than 50.   Vitamin B-12 232 - 1,245 pg/mL 615  <50 Low  R, CM     # Pituitary adenoma- s/p surgery- s/p RT- 5 weeks [[facility]; last RT in Feb 2022]  July 2022- No focal lesion identified. Within normal limits in parenchymal  echogenicity. Portal vein is patent on color Doppler imaging with normal direction of blood flow towards the liver; IMPRESSION:  Mildly dilated CBD which is usually normal after cholecystectomy. 2. Unremarkable appearance of the liver.   HISTORY OF PRESENTING ILLNESS: In a rolling walker; because of obesity.  Alone.   Ashley Alvarado 72 y.o.  female pleasant patient with pernicious anemia [severe B12 deficiency and  thrombocytopenia/anemia] is here for a follow up.   Unfortunately patient was unable to get B12 injections because of scheduling issues for the last 6 months.  Otherwise she denies any unusual fatigue or chest pain or shortness of breath or cough.  Denies any blood in stools or black or stools.  No nausea or vomiting.   Review of Systems  Constitutional:  Positive for malaise/fatigue. Negative for chills, diaphoresis, fever and weight loss.  HENT:  Negative for nosebleeds and sore throat.   Eyes:  Negative for double vision.  Respiratory:  Negative for cough, hemoptysis, sputum production, shortness of breath and wheezing.   Cardiovascular:  Negative for chest pain, palpitations, orthopnea and leg swelling.  Gastrointestinal:  Negative for abdominal pain, blood in stool, constipation, diarrhea, heartburn, melena, nausea and vomiting.   Genitourinary:  Negative for dysuria, frequency and urgency.  Musculoskeletal:  Positive for back pain and joint pain.  Skin: Negative.  Negative for itching and rash.  Neurological:  Negative for dizziness, tingling, focal weakness, weakness and headaches.  Endo/Heme/Allergies:  Does not bruise/bleed easily.  Psychiatric/Behavioral:  Negative for depression. The patient is not nervous/anxious and does not have insomnia.      MEDICAL HISTORY:  Past Medical History:  Diagnosis Date   Anemia    Complication of anesthesia    Glaucoma    Hypertension    Osteoarthritis    Pituitary adenoma (HCC)    Pre-diabetes     SURGICAL HISTORY: Past Surgical History:  Procedure Laterality Date   ABDOMINAL HYSTERECTOMY     BIOPSY  07/23/2021   Procedure: BIOPSY;  Surgeon: Meridee Score Netty Starring., MD;  Location: [facility] ENDOSCOPY;  Service: Gastroenterology;;   BRAIN SURGERY  08/2005   front meningioma   CATARACT EXTRACTION W/PHACO Right 09/08/2021   Procedure: CATARACT EXTRACTION PHACO AND INTRAOCULAR LENS PLACEMENT (IOC) RIGHT VISION BLUE 6.61 00:55.1;  Surgeon: Galen Manila, MD;  Location: [facility];  Service: Ophthalmology;  Laterality: Right;   CHOLECYSTECTOMY     COLONOSCOPY N/A 02/18/2021   Procedure: COLONOSCOPY;  Surgeon: Wyline Mood, MD;  Location: [facility] ENDOSCOPY;  Service: Gastroenterology;  Laterality: N/A;   ESOPHAGOGASTRODUODENOSCOPY (EGD) WITH PROPOFOL N/A 02/18/2021   Procedure: ESOPHAGOGASTRODUODENOSCOPY (EGD) WITH PROPOFOL;  Surgeon: Wyline Mood, MD;  Location: [facility] ENDOSCOPY;  Service: Gastroenterology;  Laterality: N/A;   ESOPHAGOGASTRODUODENOSCOPY (EGD) WITH PROPOFOL N/A 07/23/2021   Procedure: ESOPHAGOGASTRODUODENOSCOPY (EGD) WITH PROPOFOL;  Surgeon: Meridee Score Netty Starring., MD;  Location:  [facility] ENDOSCOPY;  Service: Gastroenterology;  Laterality: N/A;   EUS N/A 07/23/2021   Procedure: UPPER ENDOSCOPIC ULTRASOUND (EUS) RADIAL;  Surgeon: Lemar Lofty., MD;   Location: [facility] ENDOSCOPY;  Service: Gastroenterology;  Laterality: N/A;   PITUITARY SURGERY  11/2005   PITUITARY SURGERY  02/2016    SOCIAL HISTORY: Social History   Socioeconomic History   Marital status: Divorced    Spouse name: Not on file   Number of children: Not on file   Years of education: Not on file   Highest education level: Not on file  Occupational History   Not on file  Tobacco Use   Smoking status: Never   Smokeless tobacco: Never  Substance and Sexual Activity   Alcohol use: No    Alcohol/week: 0.0 standard drinks of alcohol   Drug use: No   Sexual activity: Not on file  Other Topics Concern   Not on file  Social History Narrative   L[place] in [place] with son [place] with mom]. Never smoked; no alcohol. Worked in [employer]. Uses walker because of knee pain.    Social Determinants of Health   Financial Resource Strain: Not on file  Food Insecurity: Not on file  Transportation Needs: Not on file  Physical Activity: Not on file  Stress: Not on file  Social Connections: Not on file  Intimate Partner Violence: Not on file    FAMILY HISTORY: Family History  Problem Relation Age of Onset   Hypertension Father    Diabetes Mother    Hypertension Mother    Kidney disease Mother    Breast cancer Neg Hx     ALLERGIES:  has No Known Allergies.  MEDICATIONS:  Current Outpatient Medications  Medication Sig Dispense Refill   albuterol (VENTOLIN HFA) 108 (90 Base) MCG/ACT inhaler Inhale 2 puffs into the lungs every 6 (six) hours as needed for shortness of breath.     amLODipine (NORVASC) 10 MG tablet Hold until followup with your PCP because your blood pressure was normal without any BP medications. (Patient taking differently: Take 10 mg by mouth daily.)     aspirin EC 81 MG tablet Take 81 mg by mouth daily.     baclofen (LIORESAL) 10 MG tablet Take 10 mg by mouth daily as needed for muscle spasms.     celecoxib (CELEBREX) 100 MG capsule Take 100 mg by  mouth 2 (two) times daily.     cetirizine (ZYRTEC) 10 MG tablet Take 10 mg by mouth daily as needed for allergies or rhinitis.     cyanocobalamin (,VITAMIN B-12,) 1000 MCG/ML injection Inject 1,000 mcg into the muscle every 30 (thirty) days.     hydrochlorothiazide (HYDRODIURIL) 25 MG tablet Hold until followup with your PCP because your blood pressure was normal without any BP medications. (Patient taking differently: Take 25 mg by mouth daily.)     latanoprost (XALATAN) 0.005 % ophthalmic solution Place 1 drop into both eyes at bedtime.     losartan (COZAAR) 100 MG tablet Hold until followup with your PCP because your blood pressure was normal without any BP medications. (Patient taking differently: Take 100 mg by mouth daily.)     Menthol-Camphor (ICY HOT ADVANCED PAIN RELIEF) 16-11 % CREA Apply 1 application topically daily as needed (knee pain).     Omega-3 1000 MG CAPS Take 1,000 mg by mouth in the morning and at bedtime.     pantoprazole (PROTONIX) 40 MG tablet Take 40 mg by mouth daily.  Vitamin D, Ergocalciferol, (DRISDOL) 1.25 MG (50000 UNIT) CAPS capsule Take 50,000 Units by mouth every Thursday.     ondansetron (ZOFRAN) 4 MG tablet Take 4 mg by mouth every 8 (eight) hours as needed for nausea. (Patient not taking: Reported on 05/27/2022)     sodium chloride (OCEAN) 0.65 % SOLN nasal spray Place 1 spray into both nostrils as needed for congestion. (Patient not taking: Reported on 05/27/2022)     No current facility-administered medications for this visit.     Marland Kitchen  PHYSICAL EXAMINATION:   Vitals:   12/07/22 1311  BP: 139/89  Pulse: 85  Temp: (!) 96.8 F (36 C)  SpO2: 96%   Filed Weights   12/07/22 1311  Weight: (!) 332 lb 6.4 oz (150.8 kg)    Physical Exam Vitals and nursing note reviewed.  HENT:     Head: Normocephalic and atraumatic.     Mouth/Throat:     Pharynx: Oropharynx is clear.  Eyes:     Extraocular Movements: Extraocular movements intact.     Pupils:  Pupils are equal, round, and reactive to light.  Cardiovascular:     Rate and Rhythm: Normal rate and regular rhythm.  Pulmonary:     Comments: Decreased breath sounds bilaterally.  Abdominal:     Palpations: Abdomen is soft.  Musculoskeletal:        General: Normal range of motion.     Cervical back: Normal range of motion.  Skin:    General: Skin is warm.  Neurological:     General: No focal deficit present.     Mental Status: She is alert and oriented to person, place, and time.  Psychiatric:        Behavior: Behavior normal.        Judgment: Judgment normal.      LABORATORY DATA:  I have reviewed the data as listed Lab Results  Component Value Date   WBC 6.8 12/07/2022   HGB 13.0 12/07/2022   HCT 41.3 12/07/2022   MCV 82.1 12/07/2022   PLT 197 12/07/2022   Recent Labs    05/27/22 1027 12/07/22 1314  NA 140 139  K 4.0 4.0  CL 102 101  CO2 30 31  GLUCOSE 145* 123*  BUN 16 17  CREATININE 0.63 0.78  CALCIUM 9.5 9.2  GFRNONAA >60 >60  PROT  --  7.3  ALBUMIN  --  3.8  AST  --  27  ALT  --  18  ALKPHOS  --  76  BILITOT  --  0.5     No results found.  ASSESSMENT & PLAN:   B12 deficiency #Pernicious anemia -anemia/thrombocytopenia-severe B12 deficiency-July 2022 B12 less than 50.  GI work-up negative for any bleeding. POSITIVE for  antiparietal antibodies; methylmalonic acid-WNL. Today Hb 13.0   # Severe B12 deficiency [July, 2022]-B12 < 50.  Continue B12 on a monthly basis.  # DISPOSITION: # b12 today # b12 injection- monthly x6  # follow up in 6 month- MD; labs- cbcbmp;B12 levels; iron studies/ferritin- and B12 injection-dr.B  All questions were answered. The patient knows to call the clinic with any problems, questions or concerns.   Earna Coder, MD 12/07/2022 2:21 PM

## 2022-12-08 LAB — VITAMIN B12: Vitamin B-12: 366 pg/mL (ref 180–914)

## 2023-01-07 ENCOUNTER — Inpatient Hospital Stay: Payer: 59 | Attending: Internal Medicine

## 2023-01-07 DIAGNOSIS — E538 Deficiency of other specified B group vitamins: Secondary | ICD-10-CM | POA: Insufficient documentation

## 2023-01-07 MED ORDER — CYANOCOBALAMIN 1000 MCG/ML IJ SOLN
1000.0000 ug | Freq: Once | INTRAMUSCULAR | Status: AC
  Administered 2023-01-07: 1000 ug via INTRAMUSCULAR
  Filled 2023-01-07: qty 1

## 2023-02-04 ENCOUNTER — Encounter: Payer: Self-pay | Admitting: Internal Medicine

## 2023-02-04 ENCOUNTER — Ambulatory Visit: Payer: 59

## 2023-02-04 ENCOUNTER — Ambulatory Visit
Admission: EM | Admit: 2023-02-04 | Discharge: 2023-02-04 | Disposition: A | Discharge status: Home / Self Care | Payer: 59 | Attending: Family Medicine | Admitting: Family Medicine

## 2023-02-04 ENCOUNTER — Encounter: Payer: Self-pay | Admitting: Emergency Medicine

## 2023-02-04 DIAGNOSIS — M25512 Pain in left shoulder: Secondary | ICD-10-CM

## 2023-02-04 DIAGNOSIS — M19012 Primary osteoarthritis, left shoulder: Secondary | ICD-10-CM | POA: Diagnosis not present

## 2023-02-04 MED ORDER — BACLOFEN 10 MG PO TABS: 10.0000 mg | ORAL_TABLET | Freq: Two times a day (BID) | ORAL | 0 refills | Status: AC | PRN

## 2023-02-04 MED ORDER — PREDNISONE 10 MG (21) PO TBPK: ORAL_TABLET | Freq: Every day | ORAL | 0 refills | Status: DC

## 2023-02-04 MED ORDER — KETOROLAC TROMETHAMINE 60 MG/2ML IM SOLN
30.0000 mg | Freq: Once | INTRAMUSCULAR | Status: AC
  Administered 2023-02-04: 30 mg via INTRAMUSCULAR

## 2023-02-04 MED ORDER — NAPROXEN 500 MG PO TABS: 500.0000 mg | ORAL_TABLET | Freq: Two times a day (BID) | ORAL | 0 refills | Status: DC

## 2023-02-04 NOTE — ED Provider Notes (Signed)
[facility]    CSN: [id number] Arrival date & time: 02/04/23  1121      History   Chief Complaint No chief complaint on file.   HPI  HPI Ashley Alvarado is a 72 y.o. female.   Ashley Alvarado presents for left arm pain.  ***Injury occurred, mechanism and which ***shoulder injured.  Reports *** immediate pain in his shoulder ***. Did ***not hear any pop or abnormal sounds with his injury.  Continues to have *** description pain when moving his arm forward. Denies ***redness, swelling. Does not feel like her ***arm is weak. Has tried *** with little relief.  ***No change in pain day vs night.  Has ***never injured this *** shoulder before.  ***she is ***right handed.    Fever : no  Sore throat: no   Cough: no Appetite: normal  Hydration: normal  Abdominal pain: no Nausea: no Vomiting: no Sleep disturbance: no *** Back Pain: no Headache: no     Past Medical History:  Diagnosis Date   Anemia    Complication of anesthesia    Glaucoma    Hypertension    Osteoarthritis    Pituitary adenoma (HCC)    Pre-diabetes     Patient Active Problem List   Diagnosis Date Noted   Thrombocytopenia (HCC) 04/28/2021   B12 deficiency 04/28/2021   Symptomatic anemia 02/15/2021   Hematochezia 02/15/2021   Constipation 02/15/2021   Morbid obesity with BMI of 50.0-59.9, adult (HCC) 02/15/2021   Essential hypertension 02/15/2021   Osteoarthritis 02/15/2021   Acute respiratory failure with hypoxia (HCC) 10/24/2015   Pituitary adenoma with extrasellar extension (HCC) 03/21/2006   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    Past Surgical History:  Procedure Laterality Date   ABDOMINAL HYSTERECTOMY     BIOPSY  07/23/2021   Procedure: BIOPSY;  Surgeon: Lemar Lofty., MD;  Location: [facility] ENDOSCOPY;  Service: Gastroenterology;;   BRAIN SURGERY  08/2005   front meningioma   CATARACT EXTRACTION W/PHACO Right 09/08/2021   Procedure: CATARACT EXTRACTION PHACO AND INTRAOCULAR  LENS PLACEMENT (IOC) RIGHT VISION BLUE 6.61 00:55.1;  Surgeon: Galen Manila, MD;  Location: [facility];  Service: Ophthalmology;  Laterality: Right;   CHOLECYSTECTOMY     COLONOSCOPY N/A 02/18/2021   Procedure: COLONOSCOPY;  Surgeon: Wyline Mood, MD;  Location: [facility] ENDOSCOPY;  Service: Gastroenterology;  Laterality: N/A;   ESOPHAGOGASTRODUODENOSCOPY (EGD) WITH PROPOFOL N/A 02/18/2021   Procedure: ESOPHAGOGASTRODUODENOSCOPY (EGD) WITH PROPOFOL;  Surgeon: Wyline Mood, MD;  Location: [facility] ENDOSCOPY;  Service: Gastroenterology;  Laterality: N/A;   ESOPHAGOGASTRODUODENOSCOPY (EGD) WITH PROPOFOL N/A 07/23/2021   Procedure: ESOPHAGOGASTRODUODENOSCOPY (EGD) WITH PROPOFOL;  Surgeon: Meridee Score Netty Starring., MD;  Location: [facility] ENDOSCOPY;  Service: Gastroenterology;  Laterality: N/A;   EUS N/A 07/23/2021   Procedure: UPPER ENDOSCOPIC ULTRASOUND (EUS) RADIAL;  Surgeon: Lemar Lofty., MD;  Location: [facility] ENDOSCOPY;  Service: Gastroenterology;  Laterality: N/A;   PITUITARY SURGERY  11/2005   PITUITARY SURGERY  02/2016    OB History   No obstetric history on file.      Home Medications    Prior to Admission medications   Medication Sig Start Date End Date Taking? Authorizing Provider  albuterol (VENTOLIN HFA) 108 (90 Base) MCG/ACT inhaler Inhale 2 puffs into the lungs every 6 (six) hours as needed for shortness of breath. 09/18/12   [provider]  amLODipine (NORVASC) 10 MG tablet Hold until followup with your PCP because your blood pressure was normal without any BP medications. Patient taking differently: Take 10  mg by mouth daily. 02/18/21   Darlin Priestly, MD  aspirin EC 81 MG tablet Take 81 mg by mouth daily.    [provider]  baclofen (LIORESAL) 10 MG tablet Take 10 mg by mouth daily as needed for muscle spasms.    [provider]  celecoxib (CELEBREX) 100 MG capsule Take 100 mg by mouth 2 (two) times daily.    [provider]  cetirizine  (ZYRTEC) 10 MG tablet Take 10 mg by mouth daily as needed for allergies or rhinitis. 10/14/20   [provider]  cyanocobalamin (,VITAMIN B-12,) 1000 MCG/ML injection Inject 1,000 mcg into the muscle every 30 (thirty) days.    [provider]  hydrochlorothiazide (HYDRODIURIL) 25 MG tablet Hold until followup with your PCP because your blood pressure was normal without any BP medications. Patient taking differently: Take 25 mg by mouth daily. 02/18/21   Darlin Priestly, MD  latanoprost (XALATAN) 0.005 % ophthalmic solution Place 1 drop into both eyes at bedtime. 11/13/12   [provider]  losartan (COZAAR) 100 MG tablet Hold until followup with your PCP because your blood pressure was normal without any BP medications. Patient taking differently: Take 100 mg by mouth daily. 02/18/21   Darlin Priestly, MD  Menthol-Camphor (ICY HOT ADVANCED PAIN RELIEF) 16-11 % CREA Apply 1 application topically daily as needed (knee pain).    [provider]  Omega-3 1000 MG CAPS Take 1,000 mg by mouth in the morning and at bedtime. 02/25/21   [provider]  ondansetron (ZOFRAN) 4 MG tablet Take 4 mg by mouth every 8 (eight) hours as needed for nausea. Patient not taking: Reported on 05/27/2022 01/28/21   [provider]  pantoprazole (PROTONIX) 40 MG tablet Take 40 mg by mouth daily.    [provider]  sodium chloride (OCEAN) 0.65 % SOLN nasal spray Place 1 spray into both nostrils as needed for congestion. Patient not taking: Reported on 05/27/2022    [provider]  Vitamin D, Ergocalciferol, (DRISDOL) 1.25 MG (50000 UNIT) CAPS capsule Take 50,000 Units by mouth every Thursday.    [provider]    Family History Family History  Problem Relation Age of Onset   Hypertension Father    Diabetes Mother    Hypertension Mother    Kidney disease Mother    Breast cancer Neg Hx     Social History Social History   Tobacco Use   Smoking status:  Never   Smokeless tobacco: Never  Substance Use Topics   Alcohol use: No    Alcohol/week: 0.0 standard drinks of alcohol   Drug use: No     Allergies   Patient has no known allergies.   Review of Systems Review of Systems: :negative unless otherwise stated in HPI.      Physical Exam Triage Vital Signs ED Triage Vitals  Encounter Vitals Group     BP      Systolic BP Percentile      Diastolic BP Percentile      Pulse      Resp      Temp      Temp src      SpO2      Weight      Height      Head Circumference      Peak Flow      Pain Score      Pain Loc      Pain Education      Exclude  from Growth Chart    No data found.  Updated Vital Signs There were no vitals taken for this visit.  Visual Acuity Right Eye Distance:   Left Eye Distance:   Bilateral Distance:    Right Eye Near:   Left Eye Near:    Bilateral Near:     Physical Exam GEN: well appearing female in no acute distress  CVS: well perfused  RESP: speaking in full sentences without pause, no respiratory distress  MSK:   *** shoulder:  No evidence of bony deformity, asymmetry, or muscle atrophy. No tenderness over long head of biceps (bicipital groove).  No TTP at [facility] joint.  Full active and passive (ABD, ADD, Flexion, extension, IR, ER). Limited *** 2/2 to pain.  Strength 5/5 grip, elbow and shoulder. No abnormal scapular function observed.  Special Tests: Juanetta Gosling: ***; Empty Can: ***, Neer's: Negative; Painful arc: Negative; Anterior Apprehension: Negative Sensation intact. Peripheral pulses intact.   UC Treatments / Results  Labs (all labs ordered are listed, but only abnormal results are displayed) Labs Reviewed - No data to display  EKG   Radiology No results found.   Procedures Procedures (including critical care time)  Medications Ordered in UC Medications - No data to display  Initial Impression / Assessment and Plan / UC Course  I have reviewed the triage vital signs  and the nursing notes.  Pertinent labs & imaging results that were available during my care of the patient were reviewed by me and considered in my medical decision making (see chart for details).      Pt is a 72 y.o.  female with *** days of *** shoulder pain after ***.   On chart review, patient diagnosed with osteoarthritis though she had a positive ANA in September 2022.  On exam, pt has tenderness at *** concerning for ***.   Obtained *** shoulder plain films.  Personally interpreted by me were ***unremarkable for fracture or dislocation. Radiologist report reviewed and additionally notes *** no soft tissue swelling.  Given ***Toradol IM/sling/brace/crutches  Patient to gradually return to normal activities, as tolerated and continue ordinary activities within the limits permitted by pain. Prescribed Naproxen sodium *** and muscle relaxer *** for pain relief.  Tylenol PRN. Advised patient to avoid OTC NSAIDs while taking prescription NSAID. Counseled patient on red flag symptoms and when to seek immediate care.  ***No red flags such as progressive major motor weakness.   Patient to follow up with orthopedic provider, if symptoms do not improve with conservative treatment.  Return and ED precautions given. Understanding voiced. Discussed MDM, treatment plan and plan for follow-up with patient/parent who agrees with plan.   Final Clinical Impressions(s) / UC Diagnoses   Final diagnoses:  None   Discharge Instructions   None    ED Prescriptions   None    PDMP not reviewed this encounter.

## 2023-02-04 NOTE — Discharge Instructions (Addendum)
Your xray showed degenerative joint disease at the left glenohumeral and acromioclavicular joint spaces with associated bone spurs. These are age related changes/arthritis. I worry that you may have injuried one of your rotator cuff muscles. Follow up with [facility] in [place] next week for further evaluation.   If medication was prescribed, stop by the pharmacy to pick up your prescriptions.  For your  pain, Take 1500 mg Tylenol twice a day, take muscle relaxer (baclofen) twice a day, take Naprosyn twice a day (hold your Celebrex while taking this pain medication),  as needed for pain.  Take your steroids as prescribed with food. Continue your Protonix/pantoprazole to prevent further acid reflux.  Rest and elevate the affected painful area.  Apply cold compresses intermittently, as needed.  As pain recedes, begin normal activities slowly as tolerated.  Follow up with primary care provider or an orthopedic provider, if symptoms persist.

## 2023-02-04 NOTE — ED Triage Notes (Signed)
Pt c/o left shoulder pain. She states she was getting up out of her wheelchair and heard it pop and has had pain and decreased ROM for about a week. She has been taking tylenol and celebrex without relief.

## 2023-02-07 ENCOUNTER — Inpatient Hospital Stay: Payer: 59 | Attending: Internal Medicine

## 2023-02-07 DIAGNOSIS — E538 Deficiency of other specified B group vitamins: Secondary | ICD-10-CM | POA: Insufficient documentation

## 2023-02-08 ENCOUNTER — Inpatient Hospital Stay: Payer: 59

## 2023-02-08 DIAGNOSIS — E538 Deficiency of other specified B group vitamins: Secondary | ICD-10-CM

## 2023-02-08 MED ORDER — CYANOCOBALAMIN 1000 MCG/ML IJ SOLN
1000.0000 ug | Freq: Once | INTRAMUSCULAR | Status: AC
  Administered 2023-02-08: 1000 ug via INTRAMUSCULAR
  Filled 2023-02-08: qty 1

## 2023-03-09 ENCOUNTER — Inpatient Hospital Stay: Payer: 59 | Attending: Internal Medicine

## 2023-03-09 DIAGNOSIS — E538 Deficiency of other specified B group vitamins: Secondary | ICD-10-CM | POA: Diagnosis present

## 2023-03-09 MED ORDER — CYANOCOBALAMIN 1000 MCG/ML IJ SOLN
1000.0000 ug | Freq: Once | INTRAMUSCULAR | Status: AC
  Administered 2023-03-09: 1000 ug via INTRAMUSCULAR
  Filled 2023-03-09: qty 1

## 2023-04-08 ENCOUNTER — Inpatient Hospital Stay: Payer: 59 | Attending: Internal Medicine

## 2023-04-08 DIAGNOSIS — E538 Deficiency of other specified B group vitamins: Secondary | ICD-10-CM | POA: Insufficient documentation

## 2023-04-08 MED ORDER — CYANOCOBALAMIN 1000 MCG/ML IJ SOLN
1000.0000 ug | Freq: Once | INTRAMUSCULAR | Status: AC
  Administered 2023-04-08: 1000 ug via INTRAMUSCULAR
  Filled 2023-04-08: qty 1

## 2023-05-09 ENCOUNTER — Inpatient Hospital Stay: Payer: 59 | Attending: Internal Medicine

## 2023-05-09 DIAGNOSIS — E538 Deficiency of other specified B group vitamins: Secondary | ICD-10-CM | POA: Diagnosis present

## 2023-05-09 MED ORDER — CYANOCOBALAMIN 1000 MCG/ML IJ SOLN
1000.0000 ug | Freq: Once | INTRAMUSCULAR | Status: AC
  Administered 2023-05-09: 1000 ug via INTRAMUSCULAR
  Filled 2023-05-09: qty 1

## 2023-06-05 IMAGING — CT CT HEAD W/O CM
3 series · 15 of 47 positions shown, 18 images · non-contrast
Comparison: July 23, 2005

CLINICAL DATA: Head trauma, minor (Age >= 65y)

EXAM:
CT HEAD WITHOUT CONTRAST
TECHNIQUE: Contiguous axial images were obtained from the base of the skull
through the vertex without intravenous contrast.

[Series 2: head wo · axial · 0.41mm/px · z∈[-157,-27]mm · 9 of 32 slices shown, 12 images]
[im 3/32  brain]
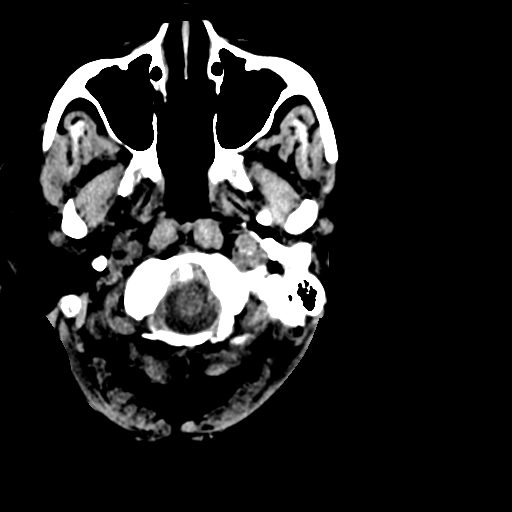
[im 3/32  bone]
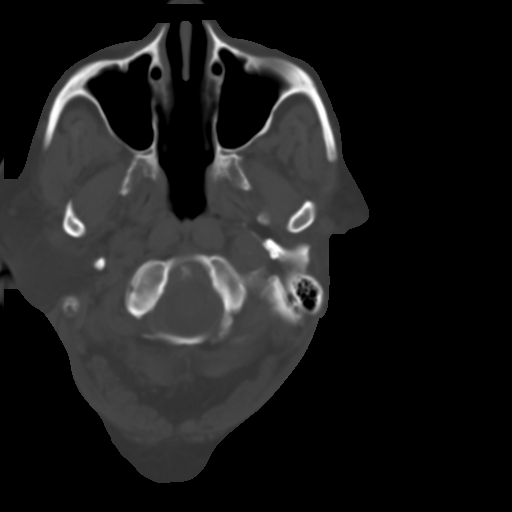
[im 6/32  brain]
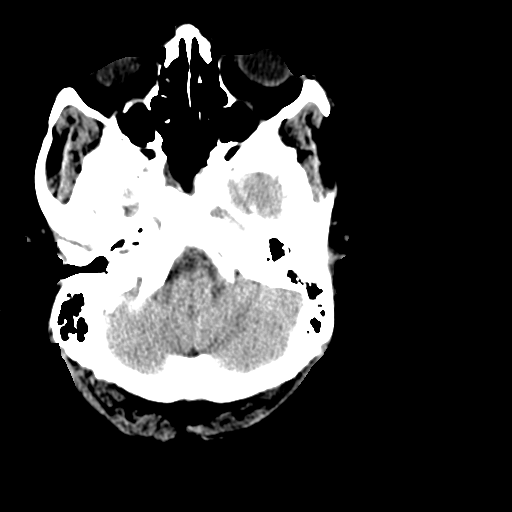
[im 9/32  brain]
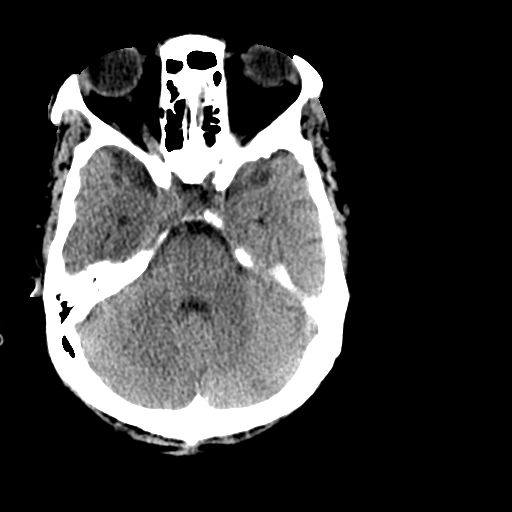
[im 12/32  brain]
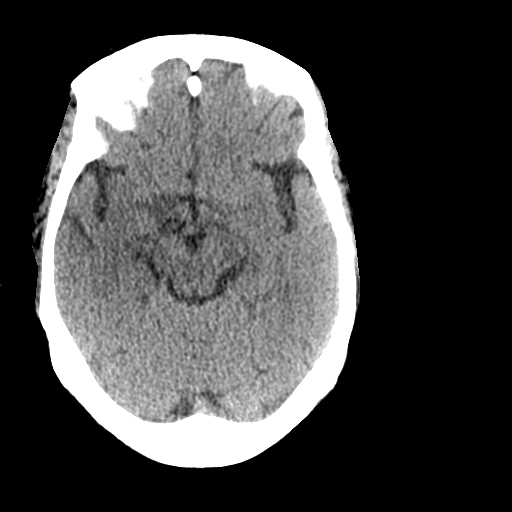
[im 17/32  brain]
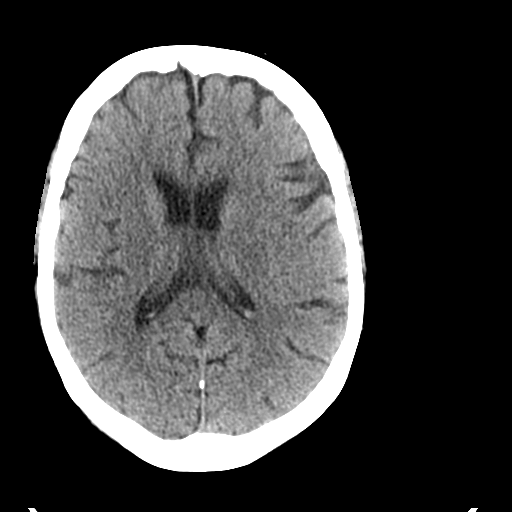
[im 17/32  bone]
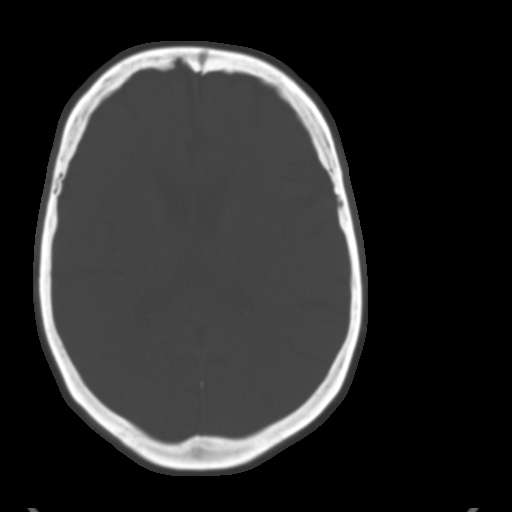
[im 20/32  brain]
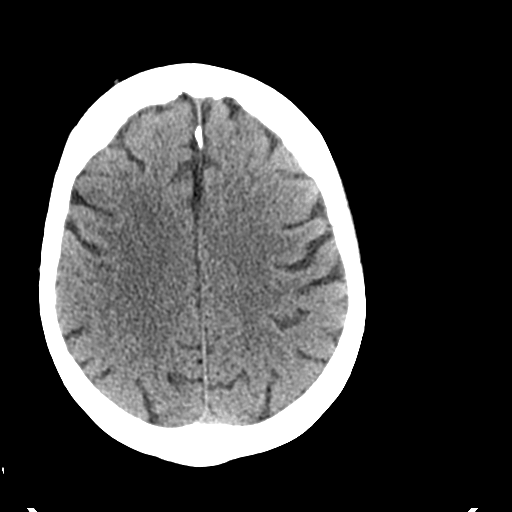
[im 23/32  brain]
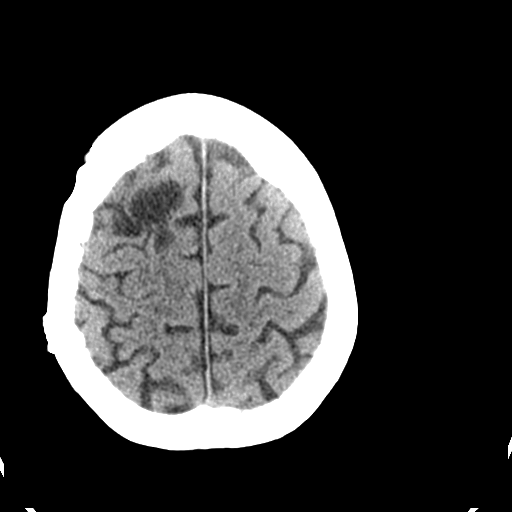
[im 26/32  brain]
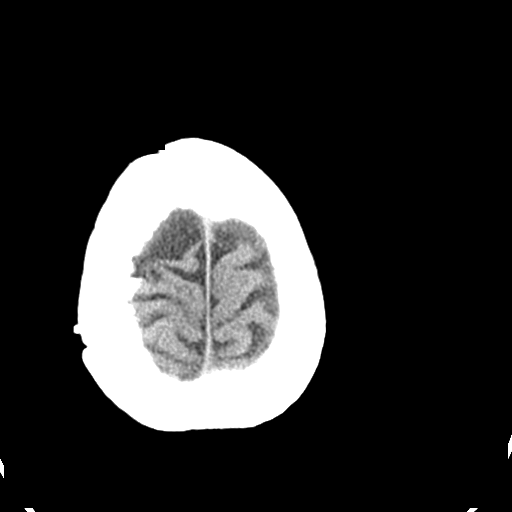
[im 29/32  brain]
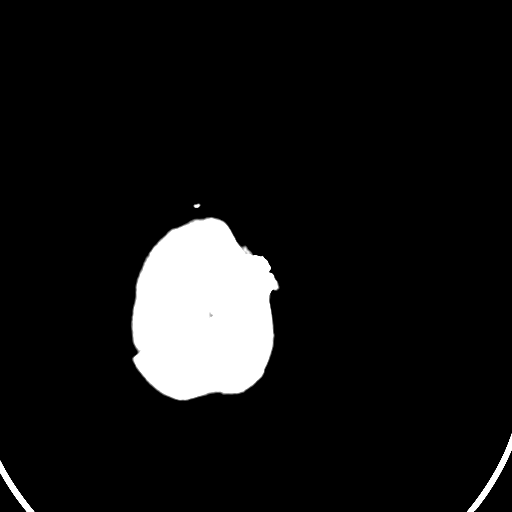
[im 29/32  bone]
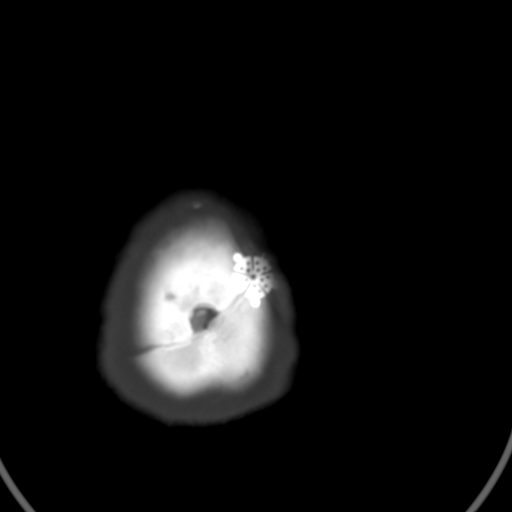

[Series 4: coronal soft tissue · coronal · 0.28mm/px · 3 of 71 slices shown]
[im 24/71  brain]
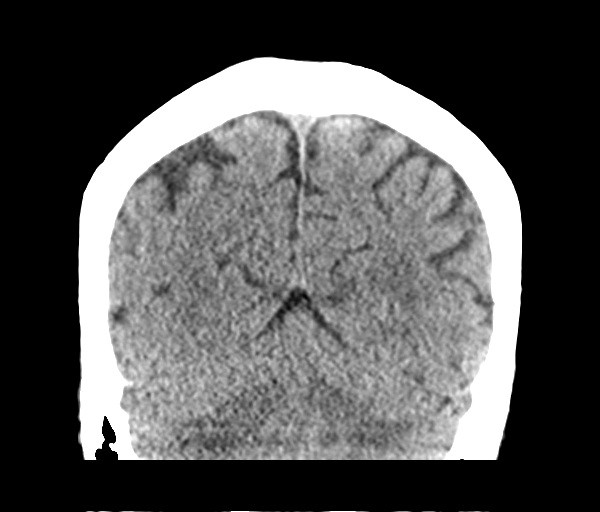
[im 32/71  brain]
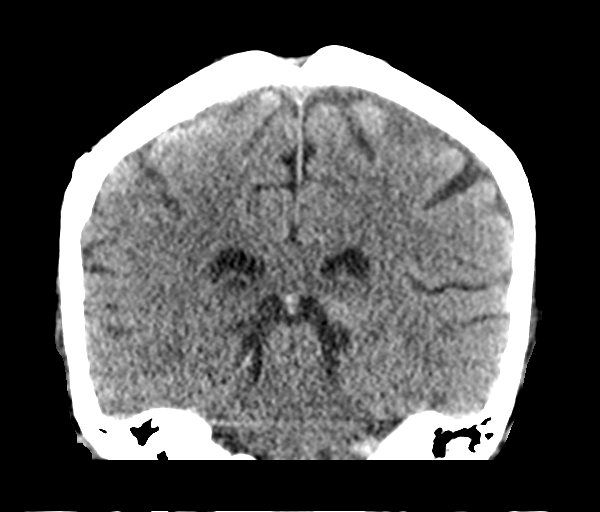
[im 39/71  brain]
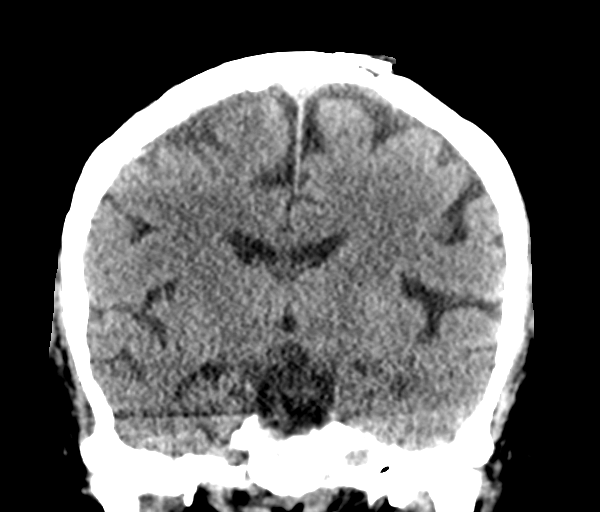

[Series 5: sagittal soft tissue · sagittal · 0.30mm/px · 3 of 57 slices shown]
[im 19/57  brain]
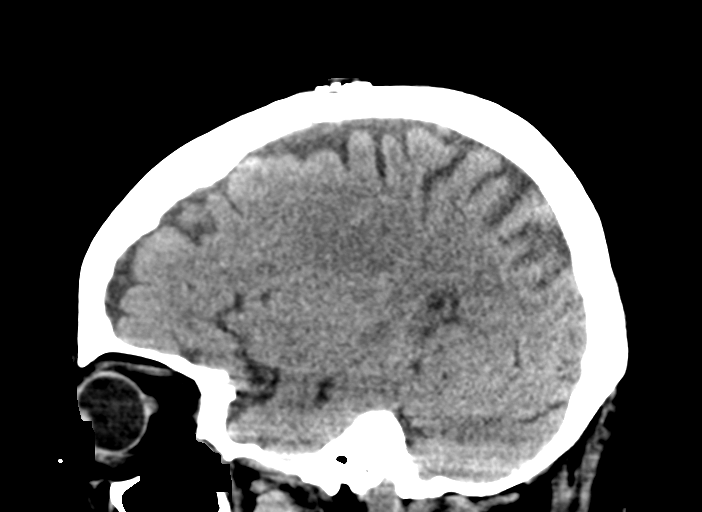
[im 29/57  brain]
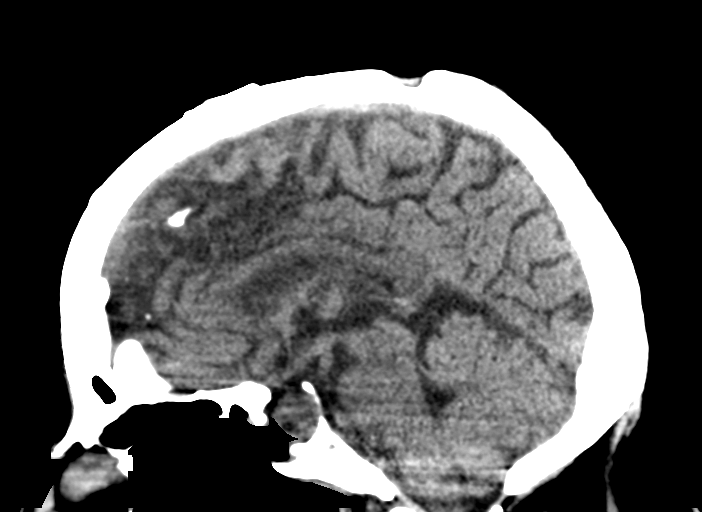
[im 38/57  brain]
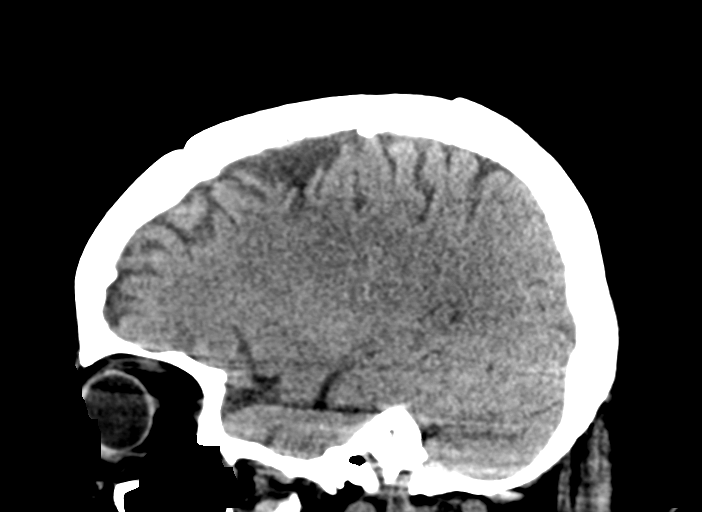

[15 of 47 positions shown; findings below may reference images not displayed]

FINDINGS: Brain: No acute infarction or hemorrhage. Postsurgical changes of
the RIGHT frontal lobe. Heterogeneous masslike appearance of the
pituitary with expansion of the sella turcica. It is estimated to
measure 15 x 14 mm (series 4, image 28).

Vascular: No hyperdense vessel or unexpected calcification.

Skull: Postsurgical changes status post resection of a mass in the
RIGHT frontal lobe. No acute fracture.

Sinuses/Orbits: Moderate mucosal thickening of bilateral maxillary
sinuses and the sphenoid sinus.

Other: None.
IMPRESSION: 1. There is heterogeneous masslike appearance of the pituitary
gland. Recommend further dedicated evaluation with pituitary
protocol MRI with and without contrast if not previously performed.
2. Otherwise no acute intracranial abnormality.

## 2023-06-09 ENCOUNTER — Encounter: Payer: Self-pay | Admitting: Internal Medicine

## 2023-06-09 ENCOUNTER — Inpatient Hospital Stay (HOSPITAL_BASED_OUTPATIENT_CLINIC_OR_DEPARTMENT_OTHER): Payer: 59 | Admitting: Internal Medicine

## 2023-06-09 ENCOUNTER — Inpatient Hospital Stay: Payer: 59

## 2023-06-09 ENCOUNTER — Inpatient Hospital Stay: Payer: 59 | Attending: Internal Medicine

## 2023-06-09 DIAGNOSIS — E538 Deficiency of other specified B group vitamins: Secondary | ICD-10-CM

## 2023-06-09 DIAGNOSIS — Z862 Personal history of diseases of the blood and blood-forming organs and certain disorders involving the immune mechanism: Secondary | ICD-10-CM | POA: Insufficient documentation

## 2023-06-09 DIAGNOSIS — Z79899 Other long term (current) drug therapy: Secondary | ICD-10-CM | POA: Diagnosis not present

## 2023-06-09 LAB — CBC WITH DIFFERENTIAL (CANCER CENTER ONLY)
Abs Immature Granulocytes: 0.01 10*3/uL (ref 0.00–0.07)
Basophils Absolute: 0 10*3/uL (ref 0.0–0.1)
Basophils Relative: 1 %
Eosinophils Absolute: 0.3 10*3/uL (ref 0.0–0.5)
Eosinophils Relative: 5 %
HCT: 42.5 % (ref 36.0–46.0)
Hemoglobin: 13.7 g/dL (ref 12.0–15.0)
Immature Granulocytes: 0 %
Lymphocytes Relative: 19 %
Lymphs Abs: 1.2 10*3/uL (ref 0.7–4.0)
MCH: 26.4 pg (ref 26.0–34.0)
MCHC: 32.2 g/dL (ref 30.0–36.0)
MCV: 82 fL (ref 80.0–100.0)
Monocytes Absolute: 0.4 10*3/uL (ref 0.1–1.0)
Monocytes Relative: 6 %
Neutro Abs: 4.3 10*3/uL (ref 1.7–7.7)
Neutrophils Relative %: 69 %
Platelet Count: 196 10*3/uL (ref 150–400)
RBC: 5.18 MIL/uL — ABNORMAL HIGH (ref 3.87–5.11)
RDW: 15.9 % — ABNORMAL HIGH (ref 11.5–15.5)
WBC Count: 6.1 10*3/uL (ref 4.0–10.5)
nRBC: 0 % (ref 0.0–0.2)

## 2023-06-09 LAB — BASIC METABOLIC PANEL
Anion gap: 8 (ref 5–15)
BUN: 20 mg/dL (ref 8–23)
CO2: 30 mmol/L (ref 22–32)
Calcium: 9.7 mg/dL (ref 8.9–10.3)
Chloride: 101 mmol/L (ref 98–111)
Creatinine, Ser: 0.68 mg/dL (ref 0.44–1.00)
GFR, Estimated: >60 mL/min (ref >60)
Glucose, Bld: 142 mg/dL — ABNORMAL HIGH (ref 70–99)
Potassium: 3.9 mmol/L (ref 3.5–5.1)
Sodium: 139 mmol/L (ref 135–145)

## 2023-06-09 LAB — VITAMIN B12: Vitamin B-12: 197 pg/mL (ref 180–914)

## 2023-06-09 LAB — IRON AND TIBC
Iron: 71 ug/dL (ref 28–170)
Saturation Ratios: 23 % (ref 10.4–31.8)
TIBC: 314 ug/dL (ref 250–450)
UIBC: 243 ug/dL

## 2023-06-09 LAB — FERRITIN: Ferritin: 126 ng/mL (ref 11–307)

## 2023-06-09 MED ORDER — CYANOCOBALAMIN 1000 MCG/ML IJ SOLN
1000.0000 ug | Freq: Once | INTRAMUSCULAR | Status: AC
  Administered 2023-06-09: 1000 ug via INTRAMUSCULAR
  Filled 2023-06-09: qty 1

## 2023-06-09 NOTE — Assessment & Plan Note (Addendum)
#  Pernicious anemia -anemia/thrombocytopenia-severe B12 deficiency-July 2022 B12 less than 50.  GI work-up negative for any bleeding. POSITIVE for  antiparietal antibodies; methylmalonic acid-WNL. Today Hb 13.0- stable.   # Severe B12 deficiency [July, 2022]-B12 < 50.  Continue B12 on a monthly basis- stable.   # DISPOSITION: # b12 today # b12 injection- monthly x6  # follow up in 6 month- MD; labs- cbcbmp;B12 levels; iron studies/ferritin- and B12 injection-dr.B

## 2023-06-09 NOTE — Progress Notes (Signed)
She wants to know if she needs rx to continue the vit d weekly?

## 2023-06-09 NOTE — Progress Notes (Signed)
[facility] CONSULT NOTE  Patient Care Team: Abram Sander, MD as PCP - General (Family Medicine) Earna Coder, MD as Consulting Physician (Oncology)  CHIEF COMPLAINTS/PURPOSE OF CONSULTATION: Thrombocytopenia/ANEMIA   HEMATOLOGY HISTORY  # July 2022- ANEMIA-THROMBOCYTOPENIA [platelets- 70-80s; WBC- ; Hb-7-8; dmitted to hospital for severe anemia-hemoglobin around 6.3.  Patient is s/p units of PRBC transfusion.  Patient noted to have B12 deficiency less than 50.   Vitamin B-12 232 - 1,245 pg/mL 615  <50 Low  R, CM     # Pituitary adenoma- s/p surgery- s/p RT- 5 weeks [[facility]; last RT in Feb 2022]  July 2022- No focal lesion identified. Within normal limits in parenchymal  echogenicity. Portal vein is patent on color Doppler imaging with normal direction of blood flow towards the liver; IMPRESSION:  Mildly dilated CBD which is usually normal after cholecystectomy. 2. Unremarkable appearance of the liver.   HISTORY OF PRESENTING ILLNESS: In a rolling walker; because of obesity.  Alone.   Ashley Alvarado 72 y.o.  female pleasant patient with pernicious anemia [severe B12 deficiency and  thrombocytopenia/anemia] is here for a follow up.   Otherwise she denies any unusual fatigue or chest pain or shortness of breath or cough.  Denies any blood in stools or black or stools.  No nausea or vomiting.   Review of Systems  Constitutional:  Positive for malaise/fatigue. Negative for chills, diaphoresis, fever and weight loss.  HENT:  Negative for nosebleeds and sore throat.   Eyes:  Negative for double vision.  Respiratory:  Negative for cough, hemoptysis, sputum production, shortness of breath and wheezing.   Cardiovascular:  Negative for chest pain, palpitations, orthopnea and leg swelling.  Gastrointestinal:  Negative for abdominal pain, blood in stool, constipation, diarrhea, heartburn, melena, nausea and vomiting.  Genitourinary:  Negative for dysuria, frequency and  urgency.  Musculoskeletal:  Positive for back pain and joint pain.  Skin: Negative.  Negative for itching and rash.  Neurological:  Negative for dizziness, tingling, focal weakness, weakness and headaches.  Endo/Heme/Allergies:  Does not bruise/bleed easily.  Psychiatric/Behavioral:  Negative for depression. The patient is not nervous/anxious and does not have insomnia.      MEDICAL HISTORY:  Past Medical History:  Diagnosis Date   Anemia    Complication of anesthesia    Glaucoma    Hypertension    Osteoarthritis    Pituitary adenoma (HCC)    Pre-diabetes     SURGICAL HISTORY: Past Surgical History:  Procedure Laterality Date   ABDOMINAL HYSTERECTOMY     BIOPSY  07/23/2021   Procedure: BIOPSY;  Surgeon: Meridee Score Netty Starring., MD;  Location: [facility] ENDOSCOPY;  Service: Gastroenterology;;   BRAIN SURGERY  08/2005   front meningioma   CATARACT EXTRACTION W/PHACO Right 09/08/2021   Procedure: CATARACT EXTRACTION PHACO AND INTRAOCULAR LENS PLACEMENT (IOC) RIGHT VISION BLUE 6.61 00:55.1;  Surgeon: Galen Manila, MD;  Location: [facility];  Service: Ophthalmology;  Laterality: Right;   CHOLECYSTECTOMY     COLONOSCOPY N/A 02/18/2021   Procedure: COLONOSCOPY;  Surgeon: Wyline Mood, MD;  Location: [facility] ENDOSCOPY;  Service: Gastroenterology;  Laterality: N/A;   ESOPHAGOGASTRODUODENOSCOPY (EGD) WITH PROPOFOL N/A 02/18/2021   Procedure: ESOPHAGOGASTRODUODENOSCOPY (EGD) WITH PROPOFOL;  Surgeon: Wyline Mood, MD;  Location: [facility] ENDOSCOPY;  Service: Gastroenterology;  Laterality: N/A;   ESOPHAGOGASTRODUODENOSCOPY (EGD) WITH PROPOFOL N/A 07/23/2021   Procedure: ESOPHAGOGASTRODUODENOSCOPY (EGD) WITH PROPOFOL;  Surgeon: Meridee Score Netty Starring., MD;  Location: [facility] ENDOSCOPY;  Service: Gastroenterology;  Laterality: N/A;  EUS N/A 07/23/2021   Procedure: UPPER ENDOSCOPIC ULTRASOUND (EUS) RADIAL;  Surgeon: Lemar Lofty., MD;  Location: [facility] ENDOSCOPY;  Service: Gastroenterology;   Laterality: N/A;   PITUITARY SURGERY  11/2005   PITUITARY SURGERY  02/2016    SOCIAL HISTORY: Social History   Socioeconomic History   Marital status: Divorced    Spouse name: Not on file   Number of children: Not on file   Years of education: Not on file   Highest education level: Not on file  Occupational History   Not on file  Tobacco Use   Smoking status: Never   Smokeless tobacco: Never  Substance and Sexual Activity   Alcohol use: No    Alcohol/week: 0.0 standard drinks of alcohol   Drug use: No   Sexual activity: Not on file  Other Topics Concern   Not on file  Social History Narrative   L[place] with son [employer] with mom]. Never smoked; no alcohol. Worked in [employer]. Uses walker because of knee pain.    Social Determinants of Health   Financial Resource Strain: Not on file  Food Insecurity: Not on file  Transportation Needs: Not on file  Physical Activity: Not on file  Stress: Not on file  Social Connections: Not on file  Intimate Partner Violence: Not on file    FAMILY HISTORY: Family History  Problem Relation Age of Onset   Hypertension Father    Diabetes Mother    Hypertension Mother    Kidney disease Mother    Breast cancer Neg Hx     ALLERGIES:  has No Known Allergies.  MEDICATIONS:  Current Outpatient Medications  Medication Sig Dispense Refill   albuterol (VENTOLIN HFA) 108 (90 Base) MCG/ACT inhaler Inhale 2 puffs into the lungs every 6 (six) hours as needed for shortness of breath.     amLODipine (NORVASC) 10 MG tablet Hold until followup with your PCP because your blood pressure was normal without any BP medications. (Patient taking differently: Take 10 mg by mouth daily.)     aspirin EC 81 MG tablet Take 81 mg by mouth daily.     baclofen (LIORESAL) 10 MG tablet Take 1 tablet (10 mg total) by mouth 2 (two) times daily as needed for muscle spasms. 20 each 0   celecoxib (CELEBREX) 100 MG capsule Take 100 mg by mouth 2 (two)  times daily.     cetirizine (ZYRTEC) 10 MG tablet Take 10 mg by mouth daily as needed for allergies or rhinitis.     cyanocobalamin (,VITAMIN B-12,) 1000 MCG/ML injection Inject 1,000 mcg into the muscle every 30 (thirty) days.     hydrochlorothiazide (HYDRODIURIL) 25 MG tablet Hold until followup with your PCP because your blood pressure was normal without any BP medications. (Patient taking differently: Take 25 mg by mouth daily.)     latanoprost (XALATAN) 0.005 % ophthalmic solution Place 1 drop into both eyes at bedtime.     losartan (COZAAR) 100 MG tablet Hold until followup with your PCP because your blood pressure was normal without any BP medications. (Patient taking differently: Take 100 mg by mouth daily.)     Menthol-Camphor (ICY HOT ADVANCED PAIN RELIEF) 16-11 % CREA Apply 1 application topically daily as needed (knee pain).     naproxen (NAPROSYN) 500 MG tablet Take 1 tablet (500 mg total) by mouth 2 (two) times daily with a meal. 30 tablet 0   Omega-3 1000 MG CAPS Take 1,000 mg by mouth in the morning  and at bedtime.     pantoprazole (PROTONIX) 40 MG tablet Take 40 mg by mouth daily.     ondansetron (ZOFRAN) 4 MG tablet Take 4 mg by mouth every 8 (eight) hours as needed for nausea. (Patient not taking: Reported on 05/27/2022)     sodium chloride (OCEAN) 0.65 % SOLN nasal spray Place 1 spray into both nostrils as needed for congestion. (Patient not taking: Reported on 05/27/2022)     Vitamin D, Ergocalciferol, (DRISDOL) 1.25 MG (50000 UNIT) CAPS capsule Take 50,000 Units by mouth every Thursday. (Patient not taking: Reported on 06/09/2023)     No current facility-administered medications for this visit.     Marland Kitchen  PHYSICAL EXAMINATION:   Vitals:   06/09/23 1006 06/09/23 1017  BP: (!) 154/103 (!) 131/99  Pulse: 87   Temp: (!) 95.6 F (35.3 C)   SpO2: 94%     Filed Weights   06/09/23 1006  Weight: (!) 337 lb 11.2 oz (153.2 kg)     Physical Exam Vitals and nursing note  reviewed.  HENT:     Head: Normocephalic and atraumatic.     Mouth/Throat:     Pharynx: Oropharynx is clear.  Eyes:     Extraocular Movements: Extraocular movements intact.     Pupils: Pupils are equal, round, and reactive to light.  Cardiovascular:     Rate and Rhythm: Normal rate and regular rhythm.  Pulmonary:     Comments: Decreased breath sounds bilaterally.  Abdominal:     Palpations: Abdomen is soft.  Musculoskeletal:        General: Normal range of motion.     Cervical back: Normal range of motion.  Skin:    General: Skin is warm.  Neurological:     General: No focal deficit present.     Mental Status: She is alert and oriented to person, place, and time.  Psychiatric:        Behavior: Behavior normal.        Judgment: Judgment normal.      LABORATORY DATA:  I have reviewed the data as listed Lab Results  Component Value Date   WBC 6.1 06/09/2023   HGB 13.7 06/09/2023   HCT 42.5 06/09/2023   MCV 82.0 06/09/2023   PLT 196 06/09/2023   Recent Labs    12/07/22 1314 06/09/23 0922  NA 139 139  K 4.0 3.9  CL 101 101  CO2 31 30  GLUCOSE 123* 142*  BUN 17 20  CREATININE 0.78 0.68  CALCIUM 9.2 9.7  GFRNONAA >60 >60  PROT 7.3  --   ALBUMIN 3.8  --   AST 27  --   ALT 18  --   ALKPHOS 76  --   BILITOT 0.5  --      No results found.  ASSESSMENT & PLAN:   B12 deficiency #Pernicious anemia -anemia/thrombocytopenia-severe B12 deficiency-July 2022 B12 less than 50.  GI work-up negative for any bleeding. POSITIVE for  antiparietal antibodies; methylmalonic acid-WNL. Today Hb 13.0- stable.   # Severe B12 deficiency [July, 2022]-B12 < 50.  Continue B12 on a monthly basis- stable.   # DISPOSITION: # b12 today # b12 injection- monthly x6  # follow up in 6 month- MD; labs- cbcbmp;B12 levels; iron studies/ferritin- and B12 injection-dr.B   All questions were answered. The patient knows to call the clinic with any problems, questions or concerns.   Earna Coder, MD 06/09/2023 10:42 AM

## 2023-07-08 ENCOUNTER — Inpatient Hospital Stay: Payer: 59

## 2023-07-11 ENCOUNTER — Inpatient Hospital Stay: Payer: 59 | Attending: Internal Medicine

## 2023-07-11 ENCOUNTER — Ambulatory Visit
Admission: EM | Admit: 2023-07-11 | Discharge: 2023-07-11 | Disposition: A | Discharge status: Home / Self Care | Payer: 59 | Attending: Family Medicine | Admitting: Family Medicine

## 2023-07-11 DIAGNOSIS — Z79899 Other long term (current) drug therapy: Secondary | ICD-10-CM | POA: Diagnosis not present

## 2023-07-11 DIAGNOSIS — M25511 Pain in right shoulder: Secondary | ICD-10-CM

## 2023-07-11 DIAGNOSIS — E538 Deficiency of other specified B group vitamins: Secondary | ICD-10-CM | POA: Diagnosis present

## 2023-07-11 MED ORDER — CYANOCOBALAMIN 1000 MCG/ML IJ SOLN
1000.0000 ug | Freq: Once | INTRAMUSCULAR | Status: AC
  Administered 2023-07-11: 1000 ug via INTRAMUSCULAR
  Filled 2023-07-11: qty 1

## 2023-07-11 MED ORDER — PREDNISONE 20 MG PO TABS: 60.0000 mg | ORAL_TABLET | Freq: Every day | ORAL | 0 refills | Status: AC

## 2023-07-11 MED ORDER — TRAMADOL HCL 50 MG PO TABS: 50.0000 mg | ORAL_TABLET | Freq: Four times a day (QID) | ORAL | 0 refills | Status: DC | PRN

## 2023-07-11 NOTE — Discharge Instructions (Signed)
Continue taking Celebrex.  Heating pad 3 times a day.  Prescription of tramadol and prednisone.  If no improvement in 2 weeks return to clinic will refer to orthopedics and physical therapy.

## 2023-07-11 NOTE — ED Triage Notes (Signed)
Patient states that she hurt arm trying to push herself up and ahas continued to push herself up with it. Now its swollen.

## 2023-07-11 NOTE — ED Provider Notes (Signed)
[facility]    CSN: [id number] Arrival date & time: 07/11/23  1125      History   Chief Complaint Chief Complaint  Patient presents with   Arm Injury    HPI Ashley Alvarado is a 72 y.o. female.   Right shoulder pain.  Sudden onset.  Started 1 week to 10 days ago.  No specific injury or trauma.  Pain radiates to the right arm.  Worse at night.  Achy pain.  6 out of 10.  Not associated with tingling or numbness.  Occasionally hurting her right elbow and right wrist as well.  Patient reports that few months ago she had left arm injury.  Due to that when she is trying to get out of the chair or couch she stopped using left arm and now she is using her right arm to push herself up.  Since then she is getting this problem.  Denies any specific fall or injury.  Denies any swelling right now.  She reports that it was swollen earlier.  Taking Celebrex 100 mg twice a day which is not helping much.   Arm Injury   Past Medical History:  Diagnosis Date   Anemia    Complication of anesthesia    Glaucoma    Hypertension    Osteoarthritis    Pituitary adenoma (HCC)    Pre-diabetes     Patient Active Problem List   Diagnosis Date Noted   Thrombocytopenia (HCC) 04/28/2021   B12 deficiency 04/28/2021   Symptomatic anemia 02/15/2021   Hematochezia 02/15/2021   Constipation 02/15/2021   Morbid obesity with BMI of 50.0-59.9, adult (HCC) 02/15/2021   Essential hypertension 02/15/2021   Osteoarthritis 02/15/2021   Acute respiratory failure with hypoxia (HCC) 10/24/2015   Pituitary adenoma with extrasellar extension (HCC) 03/21/2006   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    Past Surgical History:  Procedure Laterality Date   ABDOMINAL HYSTERECTOMY     BIOPSY  07/23/2021   Procedure: BIOPSY;  Surgeon: Lemar Lofty., MD;  Location: [facility] ENDOSCOPY;  Service: Gastroenterology;;   BRAIN SURGERY  08/2005   front meningioma   CATARACT EXTRACTION W/PHACO Right  09/08/2021   Procedure: CATARACT EXTRACTION PHACO AND INTRAOCULAR LENS PLACEMENT (IOC) RIGHT VISION BLUE 6.61 00:55.1;  Surgeon: Galen Manila, MD;  Location: [facility];  Service: Ophthalmology;  Laterality: Right;   CHOLECYSTECTOMY     COLONOSCOPY N/A 02/18/2021   Procedure: COLONOSCOPY;  Surgeon: Wyline Mood, MD;  Location: [facility] ENDOSCOPY;  Service: Gastroenterology;  Laterality: N/A;   ESOPHAGOGASTRODUODENOSCOPY (EGD) WITH PROPOFOL N/A 02/18/2021   Procedure: ESOPHAGOGASTRODUODENOSCOPY (EGD) WITH PROPOFOL;  Surgeon: Wyline Mood, MD;  Location: [facility] ENDOSCOPY;  Service: Gastroenterology;  Laterality: N/A;   ESOPHAGOGASTRODUODENOSCOPY (EGD) WITH PROPOFOL N/A 07/23/2021   Procedure: ESOPHAGOGASTRODUODENOSCOPY (EGD) WITH PROPOFOL;  Surgeon: Meridee Score Netty Starring., MD;  Location: [facility] ENDOSCOPY;  Service: Gastroenterology;  Laterality: N/A;   EUS N/A 07/23/2021   Procedure: UPPER ENDOSCOPIC ULTRASOUND (EUS) RADIAL;  Surgeon: Lemar Lofty., MD;  Location: [facility] ENDOSCOPY;  Service: Gastroenterology;  Laterality: N/A;   PITUITARY SURGERY  11/2005   PITUITARY SURGERY  02/2016    OB History   No obstetric history on file.      Home Medications    Prior to Admission medications   Medication Sig Start Date End Date Taking? Authorizing Provider  albuterol (VENTOLIN HFA) 108 (90 Base) MCG/ACT inhaler Inhale 2 puffs into the lungs every 6 (six) hours as needed for shortness of breath. 09/18/12  Yes  [provider]  amLODipine (NORVASC) 10 MG tablet Hold until followup with your PCP because your blood pressure was normal without any BP medications. Patient taking differently: Take 10 mg by mouth daily. 02/18/21  Yes Darlin Priestly, MD  aspirin EC 81 MG tablet Take 81 mg by mouth daily.   Yes [provider]  baclofen (LIORESAL) 10 MG tablet Take 1 tablet (10 mg total) by mouth 2 (two) times daily as needed for muscle spasms. 02/04/23  Yes Brimage, Seward Meth, DO  celecoxib  (CELEBREX) 100 MG capsule Take 100 mg by mouth 2 (two) times daily.   Yes [provider]  cetirizine (ZYRTEC) 10 MG tablet Take 10 mg by mouth daily as needed for allergies or rhinitis. 10/14/20  Yes [provider]  cyanocobalamin (,VITAMIN B-12,) 1000 MCG/ML injection Inject 1,000 mcg into the muscle every 30 (thirty) days.   Yes [provider]  hydrochlorothiazide (HYDRODIURIL) 25 MG tablet Hold until followup with your PCP because your blood pressure was normal without any BP medications. Patient taking differently: Take 25 mg by mouth daily. 02/18/21  Yes Darlin Priestly, MD  latanoprost (XALATAN) 0.005 % ophthalmic solution Place 1 drop into both eyes at bedtime. 11/13/12  Yes [provider]  losartan (COZAAR) 100 MG tablet Hold until followup with your PCP because your blood pressure was normal without any BP medications. Patient taking differently: Take 100 mg by mouth daily. 02/18/21  Yes Darlin Priestly, MD  Menthol-Camphor (ICY HOT ADVANCED PAIN RELIEF) 16-11 % CREA Apply 1 application topically daily as needed (knee pain).   Yes [provider]  naproxen (NAPROSYN) 500 MG tablet Take 1 tablet (500 mg total) by mouth 2 (two) times daily with a meal. 02/04/23  Yes Brimage, Vondra, DO  Omega-3 1000 MG CAPS Take 1,000 mg by mouth in the morning and at bedtime. 02/25/21  Yes [provider]  ondansetron (ZOFRAN) 4 MG tablet Take 4 mg by mouth every 8 (eight) hours as needed for nausea. 01/28/21  Yes [provider]  pantoprazole (PROTONIX) 40 MG tablet Take 40 mg by mouth daily.   Yes [provider]  predniSONE (DELTASONE) 20 MG tablet Take 3 tablets (60 mg total) by mouth daily with breakfast for 7 days. 07/11/23 07/18/23 Yes Lura Em, MD  sodium chloride (OCEAN) 0.65 % SOLN nasal spray Place 1 spray into both nostrils as needed for congestion.   Yes [provider]  traMADol (ULTRAM) 50 MG tablet Take 1 tablet (50  mg total) by mouth every 6 (six) hours as needed. 07/11/23  Yes Lura Em, MD  Vitamin D, Ergocalciferol, (DRISDOL) 1.25 MG (50000 UNIT) CAPS capsule Take 50,000 Units by mouth every Thursday.   Yes [provider]    Family History Family History  Problem Relation Age of Onset   Hypertension Father    Diabetes Mother    Hypertension Mother    Kidney disease Mother    Breast cancer Neg Hx     Social History Social History   Tobacco Use   Smoking status: Never   Smokeless tobacco: Never  Substance Use Topics   Alcohol use: No    Alcohol/week: 0.0 standard drinks of alcohol   Drug use: No     Allergies   Patient has no known allergies.   Review of Systems Review of Systems Negative other than mentioned in HPI.  Physical Exam Triage Vital Signs ED Triage Vitals  Encounter Vitals Group     BP 07/11/23  1216 125/73     Systolic BP Percentile --      Diastolic BP Percentile --      Pulse Rate 07/11/23 1216 82     Resp 07/11/23 1216 19     Temp 07/11/23 1216 98.7 F (37.1 C)     Temp Source 07/11/23 1216 Oral     SpO2 07/11/23 1216 94 %     Weight --      Height --      Head Circumference --      Peak Flow --      Pain Score 07/11/23 1215 10     Pain Loc --      Pain Education --      Exclude from Growth Chart --    No data found.  Updated Vital Signs BP 125/73 (BP Location: Left Wrist)   Pulse 82   Temp 98.7 F (37.1 C) (Oral)   Resp 19   SpO2 94%   Visual Acuity Right Eye Distance:   Left Eye Distance:   Bilateral Distance:    Right Eye Near:   Left Eye Near:    Bilateral Near:     Physical Exam Alert awake oriented time place person.  Patient is on wheelchair.  Morbid obesity. No visible or palpable deformity of C-spine noted.  Flexion extension rotation of the C-spine is preserved. No visible palpable deformity of right shoulder noted.  Palpable tenderness on right shoulder joint capsule noted.  Flexion extension  abduction and adduction is limited due to pain.  The right elbow range of motion is preserved.  No tenderness or swelling.  Right wrist range of motion is preserved.  No tenderness or range of motion restriction.  Right radial pulse 2+.  Skin overlying right upper extremity is unremarkable.  No edema of the right upper extremity noted.  UC Treatments / Results  Labs (all labs ordered are listed, but only abnormal results are displayed) Labs Reviewed - No data to display  EKG   Radiology No results found.  Procedures Procedures (including critical care time)  Medications Ordered in UC Medications - No data to display  Initial Impression / Assessment and Plan / UC Course  I have reviewed the triage vital signs and the nursing notes.  Pertinent labs & imaging results that were available during my care of the patient were reviewed by me and considered in my medical decision making (see chart for details).      Final Clinical Impressions(s) / UC Diagnoses   Final diagnoses:  Acute pain of right shoulder     Discharge Instructions      Continue taking Celebrex.  Heating pad 3 times a day.  Prescription of tramadol and prednisone.  If no improvement in 2 weeks return to clinic will refer to orthopedics and physical therapy.   ED Prescriptions     Medication Sig Dispense Auth. Provider   traMADol (ULTRAM) 50 MG tablet Take 1 tablet (50 mg total) by mouth every 6 (six) hours as needed. 15 tablet Lura Em, MD   predniSONE (DELTASONE) 20 MG tablet Take 3 tablets (60 mg total) by mouth daily with breakfast for 7 days. 21 tablet Lura Em, MD      I have reviewed the PDMP during this encounter.   Lura Em, MD 07/11/23 1248

## 2023-07-15 ENCOUNTER — Inpatient Hospital Stay: Payer: 59

## 2023-07-22 ENCOUNTER — Inpatient Hospital Stay: Payer: 59

## 2023-07-29 ENCOUNTER — Inpatient Hospital Stay: Payer: 59

## 2023-08-05 ENCOUNTER — Inpatient Hospital Stay: Payer: 59

## 2023-08-11 ENCOUNTER — Inpatient Hospital Stay: Payer: 59 | Attending: Internal Medicine

## 2023-08-11 DIAGNOSIS — E538 Deficiency of other specified B group vitamins: Secondary | ICD-10-CM | POA: Insufficient documentation

## 2023-08-11 MED ORDER — CYANOCOBALAMIN 1000 MCG/ML IJ SOLN
1000.0000 ug | Freq: Once | INTRAMUSCULAR | Status: AC
  Administered 2023-08-11: 1000 ug via INTRAMUSCULAR
  Filled 2023-08-11: qty 1

## 2023-09-12 ENCOUNTER — Inpatient Hospital Stay: Payer: 59 | Attending: Internal Medicine

## 2023-09-12 DIAGNOSIS — D510 Vitamin B12 deficiency anemia due to intrinsic factor deficiency: Secondary | ICD-10-CM | POA: Insufficient documentation

## 2023-09-12 DIAGNOSIS — E538 Deficiency of other specified B group vitamins: Secondary | ICD-10-CM

## 2023-09-12 MED ORDER — CYANOCOBALAMIN 1000 MCG/ML IJ SOLN
1000.0000 ug | Freq: Once | INTRAMUSCULAR | Status: AC
  Administered 2023-09-12: 1000 ug via INTRAMUSCULAR
  Filled 2023-09-12: qty 1

## 2023-10-10 ENCOUNTER — Inpatient Hospital Stay: Payer: 59 | Attending: Internal Medicine

## 2023-10-10 DIAGNOSIS — D510 Vitamin B12 deficiency anemia due to intrinsic factor deficiency: Secondary | ICD-10-CM | POA: Insufficient documentation

## 2023-10-10 DIAGNOSIS — E538 Deficiency of other specified B group vitamins: Secondary | ICD-10-CM

## 2023-10-10 MED ORDER — CYANOCOBALAMIN 1000 MCG/ML IJ SOLN
1000.0000 ug | Freq: Once | INTRAMUSCULAR | Status: AC
Start: 2023-10-10 — End: 2023-10-10
  Administered 2023-10-10: 1000 ug via INTRAMUSCULAR
  Filled 2023-10-10: qty 1

## 2023-11-10 ENCOUNTER — Inpatient Hospital Stay: Payer: 59 | Attending: Internal Medicine

## 2023-11-10 DIAGNOSIS — E538 Deficiency of other specified B group vitamins: Secondary | ICD-10-CM | POA: Insufficient documentation

## 2023-11-10 MED ORDER — CYANOCOBALAMIN 1000 MCG/ML IJ SOLN
1000.0000 ug | Freq: Once | INTRAMUSCULAR | Status: AC
Start: 2023-11-10 — End: 2023-11-10
  Administered 2023-11-10: 1000 ug via INTRAMUSCULAR
  Filled 2023-11-10: qty 1

## 2023-12-07 ENCOUNTER — Inpatient Hospital Stay: Payer: 59

## 2023-12-07 ENCOUNTER — Inpatient Hospital Stay (HOSPITAL_BASED_OUTPATIENT_CLINIC_OR_DEPARTMENT_OTHER): Payer: 59 | Admitting: Internal Medicine

## 2023-12-07 ENCOUNTER — Encounter: Payer: Self-pay | Admitting: Internal Medicine

## 2023-12-07 ENCOUNTER — Inpatient Hospital Stay: Payer: 59 | Attending: Internal Medicine

## 2023-12-07 DIAGNOSIS — D696 Thrombocytopenia, unspecified: Secondary | ICD-10-CM | POA: Insufficient documentation

## 2023-12-07 DIAGNOSIS — Z79899 Other long term (current) drug therapy: Secondary | ICD-10-CM | POA: Insufficient documentation

## 2023-12-07 DIAGNOSIS — E538 Deficiency of other specified B group vitamins: Secondary | ICD-10-CM

## 2023-12-07 DIAGNOSIS — D510 Vitamin B12 deficiency anemia due to intrinsic factor deficiency: Secondary | ICD-10-CM | POA: Diagnosis present

## 2023-12-07 LAB — CMP (CANCER CENTER ONLY)
ALT: 19 U/L (ref 0–44)
AST: 21 U/L (ref 15–41)
Albumin: 3.8 g/dL (ref 3.5–5.0)
Alkaline Phosphatase: 72 U/L (ref 38–126)
Anion gap: 8 (ref 5–15)
BUN: 22 mg/dL (ref 8–23)
CO2: 31 mmol/L (ref 22–32)
Calcium: 9.1 mg/dL (ref 8.9–10.3)
Chloride: 101 mmol/L (ref 98–111)
Creatinine: 0.73 mg/dL (ref 0.44–1.00)
GFR, Estimated: >60 mL/min (ref >60)
Glucose, Bld: 134 mg/dL — ABNORMAL HIGH (ref 70–99)
Potassium: 4.1 mmol/L (ref 3.5–5.1)
Sodium: 140 mmol/L (ref 135–145)
Total Bilirubin: 0.7 mg/dL (ref 0.0–1.2)
Total Protein: 7.3 g/dL (ref 6.5–8.1)

## 2023-12-07 LAB — CBC WITH DIFFERENTIAL (CANCER CENTER ONLY)
Abs Immature Granulocytes: 0.01 10*3/uL (ref 0.00–0.07)
Basophils Absolute: 0 10*3/uL (ref 0.0–0.1)
Basophils Relative: 1 %
Eosinophils Absolute: 0.3 10*3/uL (ref 0.0–0.5)
Eosinophils Relative: 5 %
HCT: 41 % (ref 36.0–46.0)
Hemoglobin: 13.1 g/dL (ref 12.0–15.0)
Immature Granulocytes: 0 %
Lymphocytes Relative: 24 %
Lymphs Abs: 1.4 10*3/uL (ref 0.7–4.0)
MCH: 26 pg (ref 26.0–34.0)
MCHC: 32 g/dL (ref 30.0–36.0)
MCV: 81.3 fL (ref 80.0–100.0)
Monocytes Absolute: 0.4 10*3/uL (ref 0.1–1.0)
Monocytes Relative: 7 %
Neutro Abs: 3.7 10*3/uL (ref 1.7–7.7)
Neutrophils Relative %: 63 %
Platelet Count: 167 10*3/uL (ref 150–400)
RBC: 5.04 MIL/uL (ref 3.87–5.11)
RDW: 16.2 % — ABNORMAL HIGH (ref 11.5–15.5)
WBC Count: 5.9 10*3/uL (ref 4.0–10.5)
nRBC: 0 % (ref 0.0–0.2)

## 2023-12-07 LAB — IRON AND TIBC
Iron: 83 ug/dL (ref 28–170)
Saturation Ratios: 25 % (ref 10.4–31.8)
TIBC: 336 ug/dL (ref 250–450)
UIBC: 253 ug/dL

## 2023-12-07 LAB — VITAMIN B12: Vitamin B-12: 167 pg/mL — ABNORMAL LOW (ref 180–914)

## 2023-12-07 LAB — FERRITIN: Ferritin: 90 ng/mL (ref 11–307)

## 2023-12-07 MED ORDER — CYANOCOBALAMIN 1000 MCG/ML IJ SOLN
1000.0000 ug | Freq: Once | INTRAMUSCULAR | Status: AC
  Administered 2023-12-07: 1000 ug via INTRAMUSCULAR
  Filled 2023-12-07: qty 1

## 2023-12-07 NOTE — Progress Notes (Signed)
 [facility] CONSULT NOTE  Patient Care Team: Patrina Boos, MD as PCP - General (Family Medicine) Gwyn Leos, MD as Consulting Physician (Oncology)  CHIEF COMPLAINTS/PURPOSE OF CONSULTATION: Thrombocytopenia/ANEMIA   HEMATOLOGY HISTORY  # July 2022- ANEMIA-THROMBOCYTOPENIA [platelets- 70-80s; WBC- ; Hb-7-8; dmitted to hospital for severe anemia-hemoglobin around 6.3.  Patient is s/p units of PRBC transfusion.  Patient noted to have B12 deficiency less than 50.   Vitamin B-12 232 - 1,245 pg/mL 615  <50 Low  R, CM     # Pituitary adenoma- s/p surgery- s/p RT- 5 weeks [[facility]; last RT in Feb 2022]  July 2022- No focal lesion identified. Within normal limits in parenchymal  echogenicity. Portal vein is patent on color Doppler imaging with normal direction of blood flow towards the liver; IMPRESSION:  Mildly dilated CBD which is usually normal after cholecystectomy. 2. Unremarkable appearance of the liver.   HISTORY OF PRESENTING ILLNESS: In a wheel chair; because of obesity.  Alone.   Ashley Alvarado 73 y.o.  female pleasant patient with pernicious anemia [severe B12 deficiency and  thrombocytopenia/anemia] is here for a follow up.  Seeing [facility] for knee pain tomorrow for an injection. Had MRI back since last visit at [facility]    Otherwise she denies any unusual fatigue or chest pain or shortness of breath or cough.  Denies any blood in stools or black or stools.  No nausea or vomiting.   Review of Systems  Constitutional:  Positive for malaise/fatigue. Negative for chills, diaphoresis, fever and weight loss.  HENT:  Negative for nosebleeds and sore throat.   Eyes:  Negative for double vision.  Respiratory:  Negative for cough, hemoptysis, sputum production, shortness of breath and wheezing.   Cardiovascular:  Negative for chest pain, palpitations, orthopnea and leg swelling.  Gastrointestinal:  Negative for abdominal pain, blood in stool, constipation,  diarrhea, heartburn, melena, nausea and vomiting.  Genitourinary:  Negative for dysuria, frequency and urgency.  Musculoskeletal:  Positive for back pain and joint pain.  Skin: Negative.  Negative for itching and rash.  Neurological:  Negative for dizziness, tingling, focal weakness, weakness and headaches.  Endo/Heme/Allergies:  Does not bruise/bleed easily.  Psychiatric/Behavioral:  Negative for depression. The patient is not nervous/anxious and does not have insomnia.      MEDICAL HISTORY:  Past Medical History:  Diagnosis Date   Anemia    Complication of anesthesia    Glaucoma    Hypertension    Osteoarthritis    Pituitary adenoma (HCC)    Pre-diabetes     SURGICAL HISTORY: Past Surgical History:  Procedure Laterality Date   ABDOMINAL HYSTERECTOMY     BIOPSY  07/23/2021   Procedure: BIOPSY;  Surgeon: Brice Campi Albino Alu., MD;  Location: [facility] ENDOSCOPY;  Service: Gastroenterology;;   BRAIN SURGERY  08/2005   front meningioma   CATARACT EXTRACTION W/PHACO Right 09/08/2021   Procedure: CATARACT EXTRACTION PHACO AND INTRAOCULAR LENS PLACEMENT (IOC) RIGHT VISION BLUE 6.61 00:55.1;  Surgeon: Clair Crews, MD;  Location: [facility];  Service: Ophthalmology;  Laterality: Right;   CHOLECYSTECTOMY     COLONOSCOPY N/A 02/18/2021   Procedure: COLONOSCOPY;  Surgeon: Luke Salaam, MD;  Location: [facility] ENDOSCOPY;  Service: Gastroenterology;  Laterality: N/A;   ESOPHAGOGASTRODUODENOSCOPY (EGD) WITH PROPOFOL  N/A 02/18/2021   Procedure: ESOPHAGOGASTRODUODENOSCOPY (EGD) WITH PROPOFOL ;  Surgeon: Luke Salaam, MD;  Location: [facility] ENDOSCOPY;  Service: Gastroenterology;  Laterality: N/A;   ESOPHAGOGASTRODUODENOSCOPY (EGD) WITH PROPOFOL  N/A 07/23/2021   Procedure: ESOPHAGOGASTRODUODENOSCOPY (EGD)  WITH PROPOFOL ;  Surgeon: Mansouraty, Albino Alu., MD;  Location: [facility] ENDOSCOPY;  Service: Gastroenterology;  Laterality: N/A;   EUS N/A 07/23/2021   Procedure: UPPER ENDOSCOPIC ULTRASOUND (EUS)  RADIAL;  Surgeon: Normie Becton., MD;  Location: [facility] ENDOSCOPY;  Service: Gastroenterology;  Laterality: N/A;   PITUITARY SURGERY  11/2005   PITUITARY SURGERY  02/2016    SOCIAL HISTORY: Social History   Socioeconomic History   Marital status: Divorced    Spouse name: Not on file   Number of children: Not on file   Years of education: Not on file   Highest education level: Not on file  Occupational History   Not on file  Tobacco Use   Smoking status: Never   Smokeless tobacco: Never  Substance and Sexual Activity   Alcohol use: No    Alcohol/week: 0.0 standard drinks of alcohol   Drug use: No   Sexual activity: Not on file  Other Topics Concern   Not on file  Social History Narrative   Lives in [place] with son [place] with mom]. Never smoked; no alcohol. Worked in [employer]. Uses walker because of knee pain.    Social Drivers of Corporate investment banker Strain: Low Risk  (07/18/2023)   Received from [facility]   Overall Financial Resource Strain (CARDIA)    Difficulty of Paying Living Expenses: Not hard at all  Food Insecurity: No Food Insecurity (07/18/2023)   Received from [facility]   Hunger Vital Sign    Ran Out of Food in the Last Year: Never true    Worried About Running Out of Food in the Last Year: Never true  Transportation Needs: No Transportation Needs (07/18/2023)   Received from [facility] - Transportation    In the past 12 months, has lack of transportation kept you from medical appointments or from getting medications?: No    Lack of Transportation (Non-Medical): No  Physical Activity: Not on file  Stress: Not on file  Social Connections: Not on file  Intimate Partner Violence: Not on file    FAMILY HISTORY: Family History  Problem Relation Age of Onset   Hypertension Father    Diabetes Mother    Hypertension Mother    Kidney disease Mother    Breast cancer Neg  Hx     ALLERGIES:  has no known allergies.  MEDICATIONS:  Current Outpatient Medications  Medication Sig Dispense Refill   albuterol  (VENTOLIN  HFA) 108 (90 Base) MCG/ACT inhaler Inhale 2 puffs into the lungs every 6 (six) hours as needed for shortness of breath.     amLODipine  (NORVASC ) 10 MG tablet Hold until followup with your PCP because your blood pressure was normal without any BP medications. (Patient taking differently: Take 10 mg by mouth daily.)     aspirin  EC 81 MG tablet Take 81 mg by mouth daily.     baclofen  (LIORESAL ) 10 MG tablet Take 1 tablet (10 mg total) by mouth 2 (two) times daily as needed for muscle spasms. 20 each 0   celecoxib (CELEBREX) 100 MG capsule Take 100 mg by mouth 2 (two) times daily.     cetirizine (ZYRTEC) 10 MG tablet Take 10 mg by mouth daily as needed for allergies or rhinitis.     cyanocobalamin  (,VITAMIN B-12,) 1000 MCG/ML injection Inject 1,000 mcg into the muscle every 30 (thirty) days.     hydrochlorothiazide  (HYDRODIURIL ) 25 MG tablet Hold until followup with your PCP  because your blood pressure was normal without any BP medications. (Patient taking differently: Take 25 mg by mouth daily.)     latanoprost  (XALATAN ) 0.005 % ophthalmic solution Place 1 drop into both eyes at bedtime.     losartan  (COZAAR ) 100 MG tablet Hold until followup with your PCP because your blood pressure was normal without any BP medications. (Patient taking differently: Take 100 mg by mouth daily.)     Menthol-Camphor (ICY HOT ADVANCED PAIN RELIEF) 16-11 % CREA Apply 1 application topically daily as needed (knee pain).     Omega-3 1000 MG CAPS Take 1,000 mg by mouth in the morning and at bedtime.     pantoprazole  (PROTONIX ) 40 MG tablet Take 40 mg by mouth daily.     sodium chloride  (OCEAN) 0.65 % SOLN nasal spray Place 1 spray into both nostrils as needed for congestion.     No current facility-administered medications for this visit.     Aaron Aas  PHYSICAL  EXAMINATION:   Vitals:   12/07/23 0913  BP: 137/76  Pulse: 82  Resp: (!) 24  Temp: (!) 96.2 F (35.7 C)  SpO2: 96%    Filed Weights   12/07/23 0913  Weight: (!) 333 lb 4.8 oz (151.2 kg)     Physical Exam Vitals and nursing note reviewed.  HENT:     Head: Normocephalic and atraumatic.     Mouth/Throat:     Pharynx: Oropharynx is clear.  Eyes:     Extraocular Movements: Extraocular movements intact.     Pupils: Pupils are equal, round, and reactive to light.  Cardiovascular:     Rate and Rhythm: Normal rate and regular rhythm.  Pulmonary:     Comments: Decreased breath sounds bilaterally.  Abdominal:     Palpations: Abdomen is soft.  Musculoskeletal:        General: Normal range of motion.     Cervical back: Normal range of motion.  Skin:    General: Skin is warm.  Neurological:     General: No focal deficit present.     Mental Status: She is alert and oriented to person, place, and time.  Psychiatric:        Behavior: Behavior normal.        Judgment: Judgment normal.      LABORATORY DATA:  I have reviewed the data as listed Lab Results  Component Value Date   WBC 5.9 12/07/2023   HGB 13.1 12/07/2023   HCT 41.0 12/07/2023   MCV 81.3 12/07/2023   PLT 167 12/07/2023   Recent Labs    12/07/22 1314 06/09/23 0922 12/07/23 0929  NA 139 139 140  K 4.0 3.9 4.1  CL 101 101 101  CO2 31 30 31   GLUCOSE 123* 142* 134*  BUN 17 20 22   CREATININE 0.78 0.68 0.73  CALCIUM 9.2 9.7 9.1  GFRNONAA >60 >60 >60  PROT 7.3  --  7.3  ALBUMIN 3.8  --  3.8  AST 27  --  21  ALT 18  --  19  ALKPHOS 76  --  72  BILITOT 0.5  --  0.7     No results found.  ASSESSMENT & PLAN:   B12 deficiency #Pernicious anemia -anemia/thrombocytopenia-severe B12 deficiency-July 2022 B12 less than 50.  GI work-up negative for any bleeding. POSITIVE for  antiparietal antibodies; methylmalonic acid-WNL. Today Hb 13.0- stable.   # Severe B12 deficiency [July, 2022]-B12 < 50.  Continue  B12 on a monthly basis- stable. Reluctant with home  injection  # DISPOSITION: # b12 today # b12 injection- monthly x 12  # follow up in 12 month- MD; labs- cbcbmp;B12 levels; iron studies/ferritin- and B12 injection-dr.B    All questions were answered. The patient knows to call the clinic with any problems, questions or concerns.   Gwyn Leos, MD 12/07/2023 10:28 AM

## 2023-12-07 NOTE — Assessment & Plan Note (Addendum)
#  Pernicious anemia -anemia/thrombocytopenia-severe B12 deficiency-July 2022 B12 less than 50.  GI work-up negative for any bleeding. POSITIVE for  antiparietal antibodies; methylmalonic acid-WNL. Today Hb 13.0- stable.   # Severe B12 deficiency [July, 2022]-B12 < 50.  Continue B12 on a monthly basis- stable. Reluctant with home injection  # DISPOSITION: # b12 today # b12 injection- monthly x 12  # follow up in 12 month- MD; labs- cbcbmp;B12 levels; iron studies/ferritin- and B12 injection-dr.B

## 2023-12-07 NOTE — Progress Notes (Signed)
 Seeing [facility] for knee pain tomorrow for an injection. Had MRI back since last visit at [facility].

## 2023-12-30 ENCOUNTER — Other Ambulatory Visit: Payer: Self-pay | Admitting: Family Medicine

## 2023-12-30 DIAGNOSIS — N281 Cyst of kidney, acquired: Secondary | ICD-10-CM

## 2024-01-04 ENCOUNTER — Inpatient Hospital Stay: Attending: Internal Medicine

## 2024-01-04 DIAGNOSIS — E538 Deficiency of other specified B group vitamins: Secondary | ICD-10-CM

## 2024-01-04 DIAGNOSIS — D510 Vitamin B12 deficiency anemia due to intrinsic factor deficiency: Secondary | ICD-10-CM | POA: Diagnosis present

## 2024-01-04 MED ORDER — CYANOCOBALAMIN 1000 MCG/ML IJ SOLN
1000.0000 ug | Freq: Once | INTRAMUSCULAR | Status: AC
  Administered 2024-01-04: 1000 ug via INTRAMUSCULAR
  Filled 2024-01-04: qty 1

## 2024-01-05 ENCOUNTER — Ambulatory Visit
Admission: RE | Admit: 2024-01-05 | Discharge: 2024-01-05 | Disposition: A | Discharge status: Home / Self Care | Source: Ambulatory Visit | Attending: Family Medicine | Admitting: Family Medicine

## 2024-01-05 DIAGNOSIS — N281 Cyst of kidney, acquired: Secondary | ICD-10-CM | POA: Insufficient documentation

## 2024-01-06 ENCOUNTER — Inpatient Hospital Stay

## 2024-01-20 ENCOUNTER — Other Ambulatory Visit: Payer: Self-pay | Admitting: Family Medicine

## 2024-01-20 DIAGNOSIS — N281 Cyst of kidney, acquired: Secondary | ICD-10-CM

## 2024-01-28 ENCOUNTER — Ambulatory Visit (INDEPENDENT_AMBULATORY_CARE_PROVIDER_SITE_OTHER)

## 2024-01-28 ENCOUNTER — Encounter: Payer: Self-pay | Admitting: Emergency Medicine

## 2024-01-28 ENCOUNTER — Ambulatory Visit
Admission: EM | Admit: 2024-01-28 | Discharge: 2024-01-28 | Disposition: A | Discharge status: Home / Self Care | Attending: Physician Assistant | Admitting: Physician Assistant

## 2024-01-28 DIAGNOSIS — J029 Acute pharyngitis, unspecified: Secondary | ICD-10-CM | POA: Diagnosis not present

## 2024-01-28 DIAGNOSIS — R062 Wheezing: Secondary | ICD-10-CM

## 2024-01-28 DIAGNOSIS — R051 Acute cough: Secondary | ICD-10-CM

## 2024-01-28 DIAGNOSIS — J209 Acute bronchitis, unspecified: Secondary | ICD-10-CM | POA: Diagnosis not present

## 2024-01-28 LAB — RESP PANEL BY RT-PCR (FLU A&B, COVID) ARPGX2
Influenza A by PCR: NEGATIVE
Influenza B by PCR: NEGATIVE
SARS Coronavirus 2 by RT PCR: NEGATIVE

## 2024-01-28 LAB — GROUP A STREP BY PCR: Group A Strep by PCR: NOT DETECTED

## 2024-01-28 MED ORDER — IPRATROPIUM-ALBUTEROL 0.5-2.5 (3) MG/3ML IN SOLN: 3.0000 mL | Freq: Four times a day (QID) | RESPIRATORY_TRACT | 0 refills | Status: AC | PRN

## 2024-01-28 MED ORDER — DEXAMETHASONE SODIUM PHOSPHATE 10 MG/ML IJ SOLN
10.0000 mg | Freq: Once | INTRAMUSCULAR | Status: AC
  Administered 2024-01-28: 10 mg via INTRAMUSCULAR

## 2024-01-28 MED ORDER — PROMETHAZINE-DM 6.25-15 MG/5ML PO SYRP: 5.0000 mL | ORAL_SOLUTION | Freq: Four times a day (QID) | ORAL | 0 refills | Status: AC | PRN

## 2024-01-28 MED ORDER — IPRATROPIUM-ALBUTEROL 0.5-2.5 (3) MG/3ML IN SOLN
3.0000 mL | Freq: Once | RESPIRATORY_TRACT | Status: AC
  Administered 2024-01-28: 3 mL via RESPIRATORY_TRACT

## 2024-01-28 MED ORDER — PREDNISONE 20 MG PO TABS: 40.0000 mg | ORAL_TABLET | Freq: Every day | ORAL | 0 refills | Status: AC

## 2024-01-28 NOTE — ED Triage Notes (Signed)
 Patient c/o cough, chest congestion, runny nose and headache that started 2 days ago.  Patient unsure of fevers.

## 2024-01-28 NOTE — Discharge Instructions (Signed)
-   You are negative for COVID, flu and strep. -Your chest x-ray was negative for pneumonia. -You have bronchitis, which is most likely viral and cough and congestion due to bronchitis can last for a couple of weeks. -You should come back if you have fever, worsening cough or increased breathing problem.  For acute worsening of symptoms or fever associated with shortness of breath please go immediately to the ER. -You were given an injection of a steroid in clinic.  Start the prednisone  medication tomorrow.  Also sent cough medicine. - Use your inhaler at home as needed for wheezing.

## 2024-01-28 NOTE — ED Provider Notes (Signed)
 [facility]    CSN: [id number] Arrival date & time: 01/28/24  0818      History   Chief Complaint Chief Complaint  Patient presents with   Cough    HPI Ashley Alvarado is a 73 y.o. female with history of hypertension, osteoarthritis, pituitary adenoma, prediabetes, obesity, and thrombocytopenia.  Patient presents with family member today for 2-day history of fatigue, cough, congestion, runny nose, headaches, sore throat, shortness of breath and wheezing.  No recorded fever, chest pain, abdominal pain, diarrhea.  No known sick contacts.  Patient has no history of asthma or COPD and is a non-smoker.  Ashley Alvarado does report that Ashley Alvarado occasionally gets wheezing related to her allergies and has an albuterol  inhaler at home that Ashley Alvarado has been using that has not really seemed to be helpful.  Also taking Mucinex .  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/2006   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    Past Surgical History:  Procedure Laterality Date   ABDOMINAL HYSTERECTOMY     BIOPSY  07/23/2021   Procedure: BIOPSY;  Surgeon: Wilhelmenia Aloha Raddle., MD;  Location: [facility] ENDOSCOPY;  Service: Gastroenterology;;   BRAIN SURGERY  08/2005   front meningioma   CATARACT EXTRACTION W/PHACO Right 09/08/2021   Procedure: CATARACT EXTRACTION PHACO AND INTRAOCULAR LENS PLACEMENT (IOC) RIGHT VISION BLUE 6.61  00:55.1;  Surgeon: Jaye Fallow, MD;  Location: [facility];  Service: Ophthalmology;  Laterality: Right;   CHOLECYSTECTOMY     COLONOSCOPY N/A 02/18/2021   Procedure: COLONOSCOPY;  Surgeon: Therisa Bi, MD;  Location: [facility] ENDOSCOPY;  Service: Gastroenterology;  Laterality: N/A;   ESOPHAGOGASTRODUODENOSCOPY (EGD) WITH PROPOFOL  N/A 02/18/2021   Procedure: ESOPHAGOGASTRODUODENOSCOPY (EGD) WITH PROPOFOL ;  Surgeon: Therisa Bi, MD;  Location: [facility] ENDOSCOPY;  Service: Gastroenterology;  Laterality: N/A;   ESOPHAGOGASTRODUODENOSCOPY (EGD) WITH PROPOFOL  N/A 07/23/2021   Procedure: ESOPHAGOGASTRODUODENOSCOPY (EGD) WITH PROPOFOL ;  Surgeon: Wilhelmenia Aloha Raddle., MD;  Location: [facility] ENDOSCOPY;  Service: Gastroenterology;  Laterality: N/A;   EUS N/A 07/23/2021   Procedure: UPPER ENDOSCOPIC ULTRASOUND (EUS) RADIAL;  Surgeon: Wilhelmenia Aloha Raddle., MD;  Location: [facility] ENDOSCOPY;  Service: Gastroenterology;  Laterality: N/A;   PITUITARY SURGERY  11/2005   PITUITARY SURGERY  02/2016    OB History   No obstetric history on file.      Home Medications    Prior to Admission medications   Medication Sig Start Date End Date Taking? Authorizing Provider  ipratropium-albuterol  (DUONEB) 0.5-2.5 (3) MG/3ML SOLN Take 3 mLs by nebulization every 6 (six) hours as needed. 01/28/24  Yes Arvis Jolan NOVAK, PA-C  predniSONE  (DELTASONE ) 20 MG tablet Take 2 tablets (40 mg total) by mouth daily for 5 days. 01/28/24 02/02/24 Yes Arvis Jolan B, PA-C  promethazine -dextromethorphan (PROMETHAZINE -DM) 6.25-15 MG/5ML syrup Take 5 mLs by mouth 4 (four) times daily as needed. 01/28/24  Yes Arvis Jolan NOVAK, PA-C  albuterol  (VENTOLIN  HFA) 108 (  90 Base) MCG/ACT inhaler Inhale 2 puffs into the lungs every 6 (six) hours as needed for shortness of breath. 09/18/12   [provider]  amLODipine  (NORVASC ) 10 MG tablet Hold until followup with your PCP because your blood pressure was normal without any BP  medications. Patient taking differently: Take 10 mg by mouth daily. 02/18/21   Awanda City, MD  aspirin  EC 81 MG tablet Take 81 mg by mouth daily.    [provider]  baclofen  (LIORESAL ) 10 MG tablet Take 1 tablet (10 mg total) by mouth 2 (two) times daily as needed for muscle spasms. 02/04/23   Brimage, Vondra, DO  celecoxib (CELEBREX) 100 MG capsule Take 100 mg by mouth 2 (two) times daily.    [provider]  cetirizine (ZYRTEC) 10 MG tablet Take 10 mg by mouth daily as needed for allergies or rhinitis. 10/14/20   [provider]  cyanocobalamin  (,VITAMIN B-12,) 1000 MCG/ML injection Inject 1,000 mcg into the muscle every 30 (thirty) days.    [provider]  hydrochlorothiazide  (HYDRODIURIL ) 25 MG tablet Hold until followup with your PCP because your blood pressure was normal without any BP medications. Patient taking differently: Take 25 mg by mouth daily. 02/18/21   Awanda City, MD  latanoprost  (XALATAN ) 0.005 % ophthalmic solution Place 1 drop into both eyes at bedtime. 11/13/12   [provider]  losartan  (COZAAR ) 100 MG tablet Hold until followup with your PCP because your blood pressure was normal without any BP medications. Patient taking differently: Take 100 mg by mouth daily. 02/18/21   Awanda City, MD  Menthol-Camphor (ICY HOT ADVANCED PAIN RELIEF) 16-11 % CREA Apply 1 application topically daily as needed (knee pain).    [provider]  Omega-3 1000 MG CAPS Take 1,000 mg by mouth in the morning and at bedtime. 02/25/21   [provider]  pantoprazole  (PROTONIX ) 40 MG tablet Take 40 mg by mouth daily.    [provider]  sodium chloride  (OCEAN) 0.65 % SOLN nasal spray Place 1 spray into both nostrils as needed for congestion.    [provider]    Family History Family History  Problem Relation Age of Onset   Hypertension Father    Diabetes Mother    Hypertension Mother    Kidney disease Mother    Breast  cancer Neg Hx     Social History Social History   Tobacco Use   Smoking status: Never   Smokeless tobacco: Never  Vaping Use   Vaping status: Never Used  Substance Use Topics   Alcohol use: No    Alcohol/week: 0.0 standard drinks of alcohol   Drug use: No     Allergies   Patient has no known allergies.   Review of Systems Review of Systems  Constitutional:  Positive for fatigue. Negative for chills, diaphoresis and fever.  HENT:  Positive for congestion, rhinorrhea and sore throat. Negative for ear pain, sinus pressure and sinus pain.   Respiratory:  Positive for cough, shortness of breath and wheezing.   Cardiovascular:  Negative for chest pain.  Gastrointestinal:  Negative for abdominal pain, nausea and vomiting.  Musculoskeletal:  Negative for arthralgias and myalgias.  Skin:  Negative for rash.  Neurological:  Positive for headaches. Negative for weakness.  Hematological:  Negative for adenopathy.     Physical Exam Triage Vital Signs ED Triage Vitals  Encounter Vitals Group     BP      Girls Systolic BP Percentile  Girls Diastolic BP Percentile      Boys Systolic BP Percentile      Boys Diastolic BP Percentile      Pulse      Resp      Temp      Temp src      SpO2      Weight      Height      Head Circumference      Peak Flow      Pain Score      Pain Loc      Pain Education      Exclude from Growth Chart    No data found.  Updated Vital Signs BP 135/83 (BP Location: Left Arm)   Pulse 79   Temp 99.1 F (37.3 C) (Oral)   Resp 20   Ht 5' 5 (1.651 m)   Wt (!) 333 lb 5.4 oz (151.2 kg)   SpO2 93%   BMI 55.47 kg/m    Physical Exam Vitals and nursing note reviewed.  Constitutional:      General: Ashley Alvarado is not in acute distress.    Appearance: Normal appearance. Ashley Alvarado is obese. Ashley Alvarado is ill-appearing. Ashley Alvarado is not toxic-appearing.  HENT:     Head: Normocephalic and atraumatic.     Nose: Congestion present.     Mouth/Throat:     Mouth: Mucous  membranes are moist.     Pharynx: Oropharynx is clear. Posterior oropharyngeal erythema present.  Eyes:     General: No scleral icterus.       Right eye: No discharge.        Left eye: No discharge.     Conjunctiva/sclera: Conjunctivae normal.  Cardiovascular:     Rate and Rhythm: Normal rate and regular rhythm.     Heart sounds: Normal heart sounds.  Pulmonary:     Effort: Pulmonary effort is normal. No respiratory distress.     Breath sounds: Wheezing and rhonchi present.  Musculoskeletal:     Cervical back: Neck supple.  Skin:    General: Skin is dry.  Neurological:     General: No focal deficit present.     Mental Status: Ashley Alvarado is alert. Mental status is at baseline.     Motor: No weakness.  Psychiatric:        Mood and Affect: Mood normal.        Behavior: Behavior normal.      UC Treatments / Results  Labs (all labs ordered are listed, but only abnormal results are displayed) Labs Reviewed  RESP PANEL BY RT-PCR (FLU A&B, COVID) [id number]  GROUP A STREP BY PCR    EKG   Radiology DG Chest 2 View Result Date: 01/28/2024 CLINICAL DATA:  73 year old female with cough and wheezing. Runny nose and headache. EXAM: CHEST - 2 VIEW COMPARISON:  Portable chest 02/15/2021 and earlier. FINDINGS: PA and lateral views. Lung volumes and mediastinal contours are stable since 2017, within normal limits. Visualized tracheal air column is within normal limits. No pneumothorax, pulmonary edema, pleural effusion or confluent lung opacity. No acute osseous abnormality identified. Negative visible bowel gas. IMPRESSION: No acute cardiopulmonary abnormality. Electronically Signed   By: VEAR Hurst M.D.   On: 01/28/2024 08:53    Procedures Procedures (including critical care time)  Medications Ordered in UC Medications  dexamethasone  (DECADRON ) injection 10 mg (10 mg Intramuscular Given 01/28/24 0912)  ipratropium-albuterol  (DUONEB) 0.5-2.5 (3) MG/3ML nebulizer solution 3 mL (3 mLs Nebulization  Given 01/28/24 0927)    Initial  Impression / Assessment and Plan / UC Course  I have reviewed the triage vital signs and the nursing notes.  Pertinent labs & imaging results that were available during my care of the patient were reviewed by me and considered in my medical decision making (see chart for details).   73 year old female with history of obesity, prediabetes, pituitary adenoma, hypertension presents for 2-day history of fatigue, cough, congestion, sore throat, headaches and wheezing.  Vitals are all stable normal.  Patient is ill-appearing but nontoxic.  On exam has nasal congestion.  Throat with erythema. Few scattered wheezes and rhonchi throughout chest.  Respiratory panel and chest x-ray obtained.  PCR strep ordered.   Chest x-ray negative for acute abnormalities.   Negative COVID, flu and strep.  Patient given DuoNeb which Ashley Alvarado found to be extremely helpful.  Ashley Alvarado has a nebulizer machine at home so I have given her prescription for DuoNeb solution encouraged her to use it every 4-6 hours as needed for shortness of breath and wheezing.  Presentation consistent with acute viral bronchitis.  Supportive care is encouraged.  Patient was given 10 mg IM dexamethasone  in clinic for wheezing.  Advised starting prednisone  tomorrow.  Send Promethazine  DM as needed for cough.  Reviewed supportive care.  Discussed typical course of most viral bronchitis.  Reviewed return and ER precautions.   Final Clinical Impressions(s) / UC Diagnoses   Final diagnoses:  Wheezing  Acute bronchitis, unspecified organism  Acute cough  Sore throat     Discharge Instructions      - You are negative for COVID, flu and strep. -Your chest x-ray was negative for pneumonia. -You have bronchitis, which is most likely viral and cough and congestion due to bronchitis can last for a couple of weeks. -You should come back if you have fever, worsening cough or increased breathing problem.  For acute  worsening of symptoms or fever associated with shortness of breath please go immediately to the ER. -You were given an injection of a steroid in clinic.  Start the prednisone  medication tomorrow.  Also sent cough medicine. - Use your inhaler at home as needed for wheezing.      ED Prescriptions     Medication Sig Dispense Auth. Provider   predniSONE  (DELTASONE ) 20 MG tablet Take 2 tablets (40 mg total) by mouth daily for 5 days. 10 tablet Arvis Huxley B, PA-C   promethazine -dextromethorphan (PROMETHAZINE -DM) 6.25-15 MG/5ML syrup Take 5 mLs by mouth 4 (four) times daily as needed. 118 mL Arvis Huxley B, PA-C   ipratropium-albuterol  (DUONEB) 0.5-2.5 (3) MG/3ML SOLN Take 3 mLs by nebulization every 6 (six) hours as needed. 360 mL Arvis Huxley NOVAK, PA-C      PDMP not reviewed this encounter.   Arvis Huxley NOVAK, PA-C 01/28/24 1044

## 2024-01-30 ENCOUNTER — Ambulatory Visit
Admission: RE | Admit: 2024-01-30 | Discharge: 2024-01-30 | Disposition: A | Discharge status: Home / Self Care | Source: Ambulatory Visit | Attending: Family Medicine | Admitting: Family Medicine

## 2024-01-30 DIAGNOSIS — N281 Cyst of kidney, acquired: Secondary | ICD-10-CM | POA: Diagnosis present

## 2024-01-30 MED ORDER — IOHEXOL 300 MG/ML  SOLN
100.0000 mL | Freq: Once | INTRAMUSCULAR | Status: AC | PRN
Start: 2024-01-30 — End: 2024-01-30
  Administered 2024-01-30: 100 mL via INTRAVENOUS

## 2024-02-03 ENCOUNTER — Telehealth: Payer: Self-pay | Admitting: *Deleted

## 2024-02-03 NOTE — Telephone Encounter (Signed)
 Patient called and said someone had talked to her and she was calling back to see what it is.  He thinks it is about her upcoming appointment.  I called her back and there was no answer and then I stayed on the phone for 3-1/2 minutes and it has music but no way to leave a message on her phone.  Will try back later

## 2024-02-03 NOTE — Telephone Encounter (Signed)
 I called back and got the patient and she thinks it is just about the appointments because she did not have any other issues and so I told her that she is having a B12 injection on Monday so I 14th at 10:30.  I told her that when she came and gets her injection she can ask one of the schedulers so that they give her a list of all the appointments because she is going to be doing it every month and she says that sounds good

## 2024-02-06 ENCOUNTER — Inpatient Hospital Stay: Attending: Internal Medicine

## 2024-02-06 DIAGNOSIS — D510 Vitamin B12 deficiency anemia due to intrinsic factor deficiency: Secondary | ICD-10-CM | POA: Diagnosis present

## 2024-02-06 DIAGNOSIS — E538 Deficiency of other specified B group vitamins: Secondary | ICD-10-CM

## 2024-02-06 MED ORDER — CYANOCOBALAMIN 1000 MCG/ML IJ SOLN
1000.0000 ug | Freq: Once | INTRAMUSCULAR | Status: AC
  Administered 2024-02-06: 1000 ug via INTRAMUSCULAR
  Filled 2024-02-06: qty 1

## 2024-03-08 ENCOUNTER — Inpatient Hospital Stay: Attending: Internal Medicine

## 2024-03-08 DIAGNOSIS — E538 Deficiency of other specified B group vitamins: Secondary | ICD-10-CM | POA: Diagnosis present

## 2024-03-08 MED ORDER — CYANOCOBALAMIN 1000 MCG/ML IJ SOLN
1000.0000 ug | Freq: Once | INTRAMUSCULAR | Status: AC
  Administered 2024-03-08: 1000 ug via INTRAMUSCULAR
  Filled 2024-03-08: qty 1

## 2024-04-09 ENCOUNTER — Inpatient Hospital Stay

## 2024-04-09 ENCOUNTER — Inpatient Hospital Stay: Attending: Internal Medicine

## 2024-04-09 DIAGNOSIS — E538 Deficiency of other specified B group vitamins: Secondary | ICD-10-CM | POA: Diagnosis present

## 2024-04-09 MED ORDER — CYANOCOBALAMIN 1000 MCG/ML IJ SOLN
1000.0000 ug | Freq: Once | INTRAMUSCULAR | Status: AC
  Administered 2024-04-09: 1000 ug via INTRAMUSCULAR
  Filled 2024-04-09: qty 1

## 2024-05-09 ENCOUNTER — Inpatient Hospital Stay: Attending: Internal Medicine

## 2024-05-09 DIAGNOSIS — E538 Deficiency of other specified B group vitamins: Secondary | ICD-10-CM | POA: Diagnosis present

## 2024-05-09 MED ORDER — CYANOCOBALAMIN 1000 MCG/ML IJ SOLN
1000.0000 ug | Freq: Once | INTRAMUSCULAR | Status: AC
  Administered 2024-05-09: 1000 ug via INTRAMUSCULAR
  Filled 2024-05-09: qty 1

## 2024-06-07 ENCOUNTER — Inpatient Hospital Stay: Attending: Internal Medicine

## 2024-06-07 DIAGNOSIS — E538 Deficiency of other specified B group vitamins: Secondary | ICD-10-CM | POA: Insufficient documentation

## 2024-06-07 MED ORDER — CYANOCOBALAMIN 1000 MCG/ML IJ SOLN
1000.0000 ug | Freq: Once | INTRAMUSCULAR | Status: AC
  Administered 2024-06-07: 1000 ug via INTRAMUSCULAR
  Filled 2024-06-07: qty 1

## 2024-06-08 ENCOUNTER — Inpatient Hospital Stay: Attending: Internal Medicine

## 2024-07-09 ENCOUNTER — Inpatient Hospital Stay: Attending: Internal Medicine

## 2024-07-09 DIAGNOSIS — E538 Deficiency of other specified B group vitamins: Secondary | ICD-10-CM

## 2024-07-09 MED ORDER — CYANOCOBALAMIN 1000 MCG/ML IJ SOLN
1000.0000 ug | Freq: Once | INTRAMUSCULAR | Status: AC
  Administered 2024-07-09: 10:00:00 1000 ug via INTRAMUSCULAR
  Filled 2024-07-09: qty 1

## 2024-08-09 ENCOUNTER — Inpatient Hospital Stay: Attending: Internal Medicine

## 2024-08-09 DIAGNOSIS — E538 Deficiency of other specified B group vitamins: Secondary | ICD-10-CM | POA: Insufficient documentation

## 2024-08-09 MED ORDER — CYANOCOBALAMIN 1000 MCG/ML IJ SOLN
1000.0000 ug | Freq: Once | INTRAMUSCULAR | Status: AC
  Administered 2024-08-09: 1000 ug via INTRAMUSCULAR
  Filled 2024-08-09: qty 1

## 2024-09-10 ENCOUNTER — Inpatient Hospital Stay: Attending: Internal Medicine

## 2024-10-08 ENCOUNTER — Inpatient Hospital Stay: Attending: Internal Medicine

## 2024-11-08 ENCOUNTER — Inpatient Hospital Stay: Attending: Internal Medicine

## 2024-12-06 ENCOUNTER — Ambulatory Visit

## 2024-12-06 ENCOUNTER — Other Ambulatory Visit

## 2024-12-06 ENCOUNTER — Ambulatory Visit: Admitting: Internal Medicine
